# Patient Record
Sex: Female | Born: 1990 | Race: White | Hispanic: No | Smoking: Never smoker
Health system: Southern US, Community
[De-identification: ages and names within clinical notes are randomized; demographics above are authoritative.]

## PROBLEM LIST (undated history)

## (undated) DIAGNOSIS — F32A Depression, unspecified: Secondary | ICD-10-CM

## (undated) DIAGNOSIS — D649 Anemia, unspecified: Secondary | ICD-10-CM

## (undated) DIAGNOSIS — I498 Other specified cardiac arrhythmias: Secondary | ICD-10-CM

## (undated) DIAGNOSIS — F329 Major depressive disorder, single episode, unspecified: Secondary | ICD-10-CM

## (undated) DIAGNOSIS — R000 Tachycardia, unspecified: Secondary | ICD-10-CM

## (undated) DIAGNOSIS — I951 Orthostatic hypotension: Secondary | ICD-10-CM

## (undated) DIAGNOSIS — F419 Anxiety disorder, unspecified: Secondary | ICD-10-CM

## (undated) DIAGNOSIS — J45909 Unspecified asthma, uncomplicated: Secondary | ICD-10-CM

## (undated) DIAGNOSIS — G90A Postural orthostatic tachycardia syndrome (POTS): Secondary | ICD-10-CM

## (undated) DIAGNOSIS — F909 Attention-deficit hyperactivity disorder, unspecified type: Secondary | ICD-10-CM

## (undated) DIAGNOSIS — F840 Autistic disorder: Secondary | ICD-10-CM

## (undated) DIAGNOSIS — T7840XA Allergy, unspecified, initial encounter: Secondary | ICD-10-CM

## (undated) HISTORY — DX: Unspecified asthma, uncomplicated: J45.909

## (undated) HISTORY — DX: Major depressive disorder, single episode, unspecified: F32.9

## (undated) HISTORY — DX: Depression, unspecified: F32.A

## (undated) HISTORY — DX: Allergy, unspecified, initial encounter: T78.40XA

## (undated) HISTORY — PX: TONSILLECTOMY: SUR1361

## (undated) HISTORY — DX: Anemia, unspecified: D64.9

---

## 2011-12-06 DIAGNOSIS — F845 Asperger's syndrome: Secondary | ICD-10-CM | POA: Insufficient documentation

## 2011-12-06 DIAGNOSIS — F419 Anxiety disorder, unspecified: Secondary | ICD-10-CM | POA: Insufficient documentation

## 2014-04-27 DIAGNOSIS — F329 Major depressive disorder, single episode, unspecified: Secondary | ICD-10-CM | POA: Insufficient documentation

## 2014-04-27 DIAGNOSIS — F32A Depression, unspecified: Secondary | ICD-10-CM | POA: Insufficient documentation

## 2015-08-26 HISTORY — PX: VAGINOPLASTY: SHX329

## 2015-12-30 ENCOUNTER — Encounter (HOSPITAL_COMMUNITY): Payer: Self-pay | Admitting: Emergency Medicine

## 2015-12-30 ENCOUNTER — Ambulatory Visit (HOSPITAL_COMMUNITY)
Admission: EM | Admit: 2015-12-30 | Discharge: 2015-12-30 | Disposition: A | Discharge status: Home / Self Care | Payer: PRIVATE HEALTH INSURANCE | Attending: Internal Medicine | Admitting: Internal Medicine

## 2015-12-30 DIAGNOSIS — L02415 Cutaneous abscess of right lower limb: Secondary | ICD-10-CM

## 2015-12-30 HISTORY — DX: Anxiety disorder, unspecified: F41.9

## 2015-12-30 HISTORY — DX: Tachycardia, unspecified: R00.0

## 2015-12-30 HISTORY — DX: Autistic disorder: F84.0

## 2015-12-30 HISTORY — DX: Attention-deficit hyperactivity disorder, unspecified type: F90.9

## 2015-12-30 MED ORDER — DOXYCYCLINE HYCLATE 100 MG PO CAPS: 100.0000 mg | ORAL_CAPSULE | Freq: Two times a day (BID) | ORAL | 0 refills | Status: DC

## 2015-12-30 MED ORDER — NAPROXEN 500 MG PO TABS: 500.0000 mg | ORAL_TABLET | Freq: Two times a day (BID) | ORAL | 0 refills | Status: DC

## 2015-12-30 NOTE — ED Triage Notes (Signed)
Patient reports back of right knee with an abscess.  History of abscess.  Noticed one week ago.  Size of knot and pain  has increased over the past 2 days

## 2015-12-30 NOTE — ED Provider Notes (Signed)
[facility]    CSN: [id number] Arrival date & time: 12/30/15  1429  First Provider Contact:  First MD Initiated Contact with Patient 12/30/15 1615        History   Chief Complaint Chief Complaint  Patient presents with  . Knee Pain    HPI Maria Bond. is a 25 y.o. female. He presents today with a one-week history of increasingly sore insect bite behind the right knee. No fever, no malaise, no nausea/vomiting. History of 2 prior similar episodes involving the right knee; those were anterior, and this one is posterior.     HPI  Past Medical History:  Diagnosis Date  . ADHD (attention deficit hyperactivity disorder)   . Anxiety   . Autistic disorder   . Tachycardia    Past Surgical History:  Procedure Laterality Date  . gender reassignment       Home Medications    Prior to Admission medications   Medication Sig Start Date End Date Taking? Authorizing Provider  amphetamine-dextroamphetamine (ADDERALL) 15 MG tablet Take 15 mg by mouth 2 (two) times daily.   Yes Historical Provider, MD  busPIRone (BUSPAR) 7.5 MG tablet Take 7.5 mg by mouth 2 (two) times daily.   Yes Historical Provider, MD  escitalopram (LEXAPRO) 20 MG tablet Take 20 mg by mouth daily.   Yes Historical Provider, MD  estradiol (ESTRACE) 2 MG tablet Take 2 mg by mouth daily.   Yes Historical Provider, MD  propranolol (INDERAL) 10 MG tablet Take 10 mg by mouth 2 (two) times daily.   Yes Historical Provider, MD    Family History Family History  Problem Relation Age of Onset  . Healthy Mother     Social History Social History  Substance Use Topics  . Smoking status: Never Smoker  . Smokeless tobacco: Never Used  . Alcohol use No     Allergies   Sulfa antibiotics   Review of Systems Review of Systems  All other systems reviewed and are negative.    Physical Exam Triage Vital Signs ED Triage Vitals  Enc Vitals Group     BP 12/30/15 1553 104/70     Pulse Rate 12/30/15  1553 94     Resp 12/30/15 1553 18     Temp 12/30/15 1553 98.1 F (36.7 C)     Temp Source 12/30/15 1553 Oral     SpO2 12/30/15 1553 99 %     Weight --      Height --      Pain Score 12/30/15 1546 3   Updated Vital Signs BP 104/70 (BP Location: Left Wrist)   Pulse 94   Temp 98.1 F (36.7 C) (Oral)   Resp 18   SpO2 99%  Physical Exam  Constitutional: He is oriented to person, place, and time. No distress.  Alert, nicely groomed  HENT:  Head: Atraumatic.  Eyes:  Conjugate gaze, no eye redness/drainage  Neck: Neck supple.  Cardiovascular: Normal rate.   Pulmonary/Chest: No respiratory distress.  Abdominal: He exhibits no distension.  Musculoskeletal: Normal range of motion.  Neurological: He is alert and oriented to person, place, and time.  Skin: Skin is warm and dry.  No cyanosis 2.5 inch zone of erythema behind the right knee, with 2 inch zone of induration and a central pustule. No drainage expressible.  Nursing note and vitals reviewed.    UC Treatments / Results   Procedures Procedures (including critical care time)      Skin was cleaned with  Hibiclens and infiltrated with 2% lidocaine. A stab incision was made and 4-5 mL's of Purulent Material were expressed. The wound was bluntly probed, and irrigated with saline. Antibiotic ointment and a bandage were applied.  Final Clinical Impressions(s) / UC Diagnoses   Final diagnoses:  Abscess of right knee   Wash gently with soap/water 1-2x daily, apply antibiotic ointment/bandaid. Recheck if increasing redness/swelling/drainage/pain or new fever >100.5 occurs. Anticipate gradual healing over the next 7-10 days; if persistent for >3 wks have rechecked.  New Prescriptions Discharge Medication List as of 12/30/2015  5:16 PM    START taking these medications   Details  doxycycline (VIBRAMYCIN) 100 MG capsule Take 1 capsule (100 mg total) by mouth 2 (two) times daily., Starting Sat 12/30/2015, Normal    rx naproxen  also sent to the pharmacy, pt reported after discharge that he would need something for pain.     Eustace MooreLaura W Lindzey Zent, MD 01/01/16 1140

## 2015-12-30 NOTE — Discharge Instructions (Addendum)
Wash gently with soap/water 1-2x daily, apply antibiotic ointment/bandaid. Recheck if increasing redness/swelling/drainage/pain or new fever >100.5 occurs. Anticipate gradual healing over the next 7-10 days; if persistent for >3 wks have rechecked.

## 2016-01-04 ENCOUNTER — Ambulatory Visit
Payer: PRIVATE HEALTH INSURANCE | Attending: Family Medicine | Admitting: Rehabilitative and Restorative Service Providers"

## 2016-01-04 DIAGNOSIS — R2681 Unsteadiness on feet: Secondary | ICD-10-CM | POA: Insufficient documentation

## 2016-01-04 DIAGNOSIS — R2689 Other abnormalities of gait and mobility: Secondary | ICD-10-CM | POA: Diagnosis present

## 2016-01-04 NOTE — Therapy (Signed)
[facility] [address] [place], [place], [address] Phone: [phone]   Fax:  [phone]  Patient Details  Name: Maria Bond. MRN: [id number] Date of Birth: 11-Jun-1990 Referring Provider:  Patria Mane, MD  Encounter Date: 01/04/2016  Mobility/Seating Evaluation    PATIENT INFORMATION: Name: Maria Bond. DOB: ?????  Sex: M (transgender female) Date seen: 01/04/2016 Time: 0930  Address:  [place], [place], [place] [address] Physician: Dr. Patria Mane This evaluation/justification form will serve as the LMN for the following suppliers: __________________________ Supplier: [employer] Contact Person: Deberah Pelton, ATP Phone:  ?????   Seating Therapist: Rudell Cobb, MPT Phone:   [phone]   Phone: see vendor notes    Spouse/Parent/Caregiver name: n/a  Phone number: ????? Insurance/Payer: medcost      Reason for Referral: ultralightweight manual wheelchair  Patient/Caregiver Goals: obtain lighter weight wheelchair  Patient was seen for face-to-face evaluation for new manual wheelchair.  Also present was Deberah Pelton and friend of patient Maria Bond) to discuss recommendations and wheelchair options.  Further paperwork was completed and sent to vendor.  Patient appears to qualify for manual mobility device at this time per objective findings.   MEDICAL HISTORY: Diagnosis: Primary Diagnosis: Weakness, POTS, ADHD, autistic Onset: 2015 Diagnosis: Began as heat intolerance in 2015, led to weakness, and POTS. 8 months ago worsened when getting to class reporting dizziness, shortness of breath, confusion.    _0 Progressive Disease Relevant past and future surgeries: gender reassignment surgery   Height: 5'4" Weight: 112 lbs Explain recent changes or trends in weight: ?????   History including Falls: Multiple episodes of near syncope with getting weakness and having to sit on ground in  community.    HOME ENVIRONMENT: _1 House  _2 Condo/town home  _3 Apartment  _4 Assisted Living    _5 Lives Alone _6  Lives with Others                                                                                          Hours with caregiver: intermittent assist  _7 Home is accessible to patient           Stairs      _8 Yes _9  No     Ramp _10 Yes _11 No Comments:  ramp to be installed by apartment complex; stays independent 8-10 hours/day during roommates 3rd shift work   COMMUNITY ADL: TRANSPORTATION: _12 Car    _13 Van    <GEZMOQHUTMLYYTKP>_5<\/WSFKCLEXNTZGYFVC>_94 Public Transportation    _15 Adapted w/c Lift    _16 Ambulance    _17 Other:       _18 Sits in wheelchair during transport  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  Other: [employer]     FUNCTIONAL/SENSORY PROCESSING SKILLS:  Handedness:   _19 Right     _20 Left    _21 NA  Comments:  ?????  Functional Processing Skills for Wheeled Mobility _22 Processing Skills are adequate for safe wheelchair operation  Areas of concern than may interfere with safe operation of wheelchair Description of problem   _23  Attention to environment      _24   Judgment      _0  Hearing  _1  Vision or visual processing      _2 Motor Planning  _3  Fluctuations in Behavior  ?????    VERBAL COMMUNICATION: _4 WFL receptive _5  WFL expressive _6 Understandable  _7 Difficult to understand  _8 non-communicative _9  Uses an augmented communication device  CURRENT SEATING / MOBILITY: Current Mobility Base:  _10 None _11 Dependent _12 Manual _13 Scooter _14 Power  Type of Control: ?????  Manufacturer:  ?????Size:  ?????Age: 77 months ago rental manual w/c out of pocket expense with comfort cushion  Current Condition of Mobility Base:  good   Current Wheelchair components:  manual w/c with swing away/ elevating leg rest   Describe posture in present seating system:  midline, good upright position       SENSATION and SKIN ISSUES: Sensation _15 Intact  _16 Impaired _17 Absent  Level of sensation: mild area of sensory change after gender reassignment surgery Pressure Relief: Able to perform effective pressure relief :    _18 Yes  _19  No Method: tricep press, leaning If not, Why?: n/a  Skin Issues/Skin Integrity Current Skin Issues  _20 Yes _21 No _22 Intact _23  Red area_24  Open Area  _25 Scar Tissue _26 At risk from prolonged sitting Where  abcess on back of R knee  History of Skin Issues  _27 Yes _28 No Where  ????? When  ?????  Hx of skin flap surgeries  _29 Yes _30 No Where  ????? When  ?????  Limited sitting tolerance _31 Yes _32 No Hours spent sitting in wheelchair daily: continuous 5 hours during class, intermittently in w/c throughout the rest of the day.  Vast majority of the time using manual w/c in home and community.  Prior to rental manual w/c, was unable to get to classes.    Complaint of Pain:  Please describe: back pain after sitting in the wheelchair; h/o R rotator cuff injury that can get irritated with propulsion of current rental manual w/c.    Swelling/Edema: none   ADL STATUS (in reference to wheelchair use):  Indep Assist Unable Indep with Equip Not assessed Comments  Dressing x ????? ????? ????? ????? ?????  Eating x ????? ????? ????? ????? ?????  Toileting x ????? ????? ????? ????? ?????  Bathing x ????? ????? ????? ????? shower chair  Grooming/Hygiene x ????? ????? ????? ????? ?????  Meal Prep ????? x ????? ????? ????? difficulty due to autism  IADLS ????? x ????? ????? ????? ?????  Bowel Management: _33 Continent  _34 Incontinent  _35 Accidents Comments:  ?????  Bladder Management: _36 Continent  _37 Incontinent  _38 Accidents Comments:  ?????     WHEELCHAIR SKILLS: Manual w/c Propulsion: _39 UE or LE strength and endurance sufficient to participate in ADLs using manual wheelchair Arm : _40 left _41 right   _42 Both      Distance: uses legs indoors and around school environment Foot:  _43 left  _44 right   _45 Both  Operate Scooter: _46  Strength, hand grip, balance and transfer appropriate for use _47 Living environment is accessible for use of scooter  Operate Power w/c:  _48  Std. Joystick   _49  Alternative Controls Indep _50  Assist _51  Dependent/unable _52  N/A _53   _54 Safe          _55  Functional      Distance: ?????  Bed confined without wheelchair _56  Yes _57  No   STRENGTH/RANGE OF MOTION:  ????? Range of Motion Strength  Shoulder L UE is WFLs Mild decrease on the right side to 170 degrees 5/5 for L shoulder flexion/abudction 4+/5 for R shoulder flexion/abduction  Elbow WFLs bilaterally 5/5 L elbow flexion extension 5/5 R elbow flexion/extension  Wrist/Hand  WFLs 5/5 for L wrist flexion/extension 5/5 for R wrist flexion/extension  Hip WFLs 5/5 for L hip flexion 5/5 for R hip flexion  Knee WFLs 5/5 for L knee flexion/extension 5/5 for R knee flexion/extension  Ankle WFLs 5/5 for L ankle DF 5/5 for R ankle DF     MOBILITY/BALANCE:  _0  Patient is totally dependent for mobility  ?????    Balance Transfers Ambulation  Sitting Balance: Standing Balance: _1  Independent _2  Independent/Modified Independent  _3  WFL     _4  WFL _5  Supervision _6  Supervision  _7  Uses UE for balance  _8  Supervision _9  Min Assist _10  Ambulates with Assist  ?????    _11  Min Assist _12  Min assist _13  Mod Assist _14  Ambulates with Device:      _15  RW  _16  StW  _17  Cane  _18  ?????  _19  Mod Assist _20  Mod assist _21  Max assist   _22  Max Assist _23  Max assist _24  Dependent _25  Indep. Short Distance Only  _26  Unable _27  Unable _28  Lift / Sling Required Distance (in feet)  ?????   _29  Sliding board _30  Unable to Ambulate (see explanation below)  Cardio Status:  _31 Intact  _32  Impaired   _33  NA     has POTS, reports weakness during campus negotiation and during household tasks  Respiratory Status:  _34 Intact   _35 Impaired   _36 NA     ?????  Orthotics/Prosthetics: none  Comments (Address manual vs power w/c vs scooter): BP at rest  today is 103/65, HR=93 at rest. After walking x 150 feet BP=112/75, 100 bpm.  With ambulation in the clinic, the patient experiences greater difficulty with speech reporting "hard to tell how I feel".   *The patient has a continuing decline in mobility worse over the past 8 months.  She is unable to walk for household function due to lightheadedness/weakness associated with POTS.  She is unable to walk long distances required to access school campus and to participate in course requirements of frequent standing.  Her current rental manual w/c is heavy and she is unable to load this into/out of car by herself.  She would benefit from ultralightweight w/c due to h/o R RTC injury and ability to navigate environment/load w/c into car more independently.          Anterior / Posterior Obliquity Rotation-Pelvis ?????  PELVIS    _37  _38  _39   Neutral Posterior Anterior  _40  _41  _42   WFL Rt elev Lt elev  _43  _44  _45   WFL Right Left                      Anterior    Anterior     _46  Fixed _47  Other _48  Partly Flexible _49  Flexible   _50  Fixed _51  Other _52  Partly Flexible  _53  Flexible  _54  Fixed _55  Other _56  Partly Flexible  _57  Flexible   TRUNK  _58  _59  _60   WFL ? Thoracic ? Lumbar  Kyphosis Lordosis  _61  _62  _63   WFL Convex Convex  Right Left _64 c-curve _65 s-curve _66 multiple  _67  Neutral _68  Left-anterior _69  Right-anterior     _70  Fixed _71  Flexible _72  Partly Flexible _73  Other  _74  Fixed _75  Flexible _76  Partly Flexible _77  Other  _78  Fixed             _79  Flexible _80  Partly Flexible _81  Other    Position Windswept  ?????  HIPS          _82            _83               _84   Neutral       Abduct        ADduct         _0           _1            _2   Neutral Right           Left      _3  Fixed _4  Subluxed _5  Partly Flexible _6  Dislocated _7  Flexible  _8  Fixed _9  Other _10  Partly Flexible  _11  Flexible                 Foot Positioning Knee Positioning  ?????    _12  WFL  _13 Lt _14 Rt _15  WFL  _16 Lt _17 Rt    KNEES ROM  concerns: ROM concerns:    & Dorsi-Flexed _18 Lt _19 Rt ?????    FEET Plantar Flexed _20 Lt _21 Rt      Inversion                 _22 Lt _23 Rt      Eversion                 _24 Lt _25 Rt     HEAD _26  Functional _27  Good Head Control  ?????  & _28  Flexed         _29  Extended _30  Adequate Head Control    NECK _31  Rotated  Lt  _32  Lat Flexed Lt _33  Rotated  Rt _34  Lat Flexed Rt _35  Limited Head Control     _36  Cervical Hyperextension _37  Absent  Head Control     SHOULDERS ELBOWS WRIST& HAND ?????      Left     Right    Left     Right    Left     Right   U/E _38 Functional           _39 Functional WFLs WFLs _40 Fisting             _41 Fisting      _42 elev   _43 dep      _44 elev   _45 dep       _46 pro -_47 retract     _48 pro  _49 retract _50 subluxed             _51 subluxed           Goals for Wheelchair Mobility  _52  Independence with mobility in the home with motor related ADLs (MRADLs)  _53  Independence with MRADLs in the community _54  Provide dependent mobility  _55  Provide recline     _56 Provide tilt   Goals for Seating system _57  Optimize pressure distribution _58  Provide support needed to facilitate function or safety _59  Provide corrective forces to assist with maintaining or improving posture _60  Accommodate client's posture:   current seated postures and positions are not flexible or will not tolerate corrective forces _61  Client to be independent with relieving pressure in the wheelchair _62 Enhance physiological function such as breathing, swallowing, digestion  Simulation ideas/Equipment trials:can propel manual wheelchair independently  State why other equipment was unsuccessful:?????   MOBILITY BASE RECOMMENDATIONS and JUSTIFICATION: MOBILITY COMPONENT JUSTIFICATION  Manufacturer: [employer] Model: ?????   Size: Width 14Seat Depth 18 _63 provide transport from point A to B      _64 promote Indep mobility  _65 is not a safe, functional ambulator _66 walker or cane inadequate _67 non-standard width/depth necessary to accommodate  anatomical measurement _68  ?????  _69 Manual Mobility Base _70 non-functional ambulator    _71 Scooter/POV  _72 can safely operate  _73 can safely transfer   _74 has adequate trunk stability  _75 cannot functionally propel manual w/c  _76   Power Mobility Base  _0 non-ambulatory  _1 cannot functionally propel manual wheelchair  _2  cannot functionally and safely operate scooter/POV _3 can safely operate and willing to  _4 Stroller Base _5 infant/child  _6 unable to propel manual wheelchair _7 allows for growth _8 non-functional ambulator _9 non-functional UE _10 Indep mobility is not a goal at this time  _11 Tilt  _12 Forward _13 Backward _14 Powered tilt  _15 Manual tilt  _16 change position against gravitational force on head and shoulders  _17 change position for pressure relief/cannot weight shift _18 transfers  _19 management of tone _20 rest periods _21 control edema _22 facilitate postural control  _23  ?????  _24 Recline  _25 Power recline on power base _26 Manual recline on manual base  _27 accommodate femur to back angle  _28 bring to full recline for ADL care  _29 change position for pressure relief/cannot weight shift _30 rest periods _31 repositioning for transfers or clothing/diaper /catheter changes _32 head positioning  _33 Lighter weight required _34 self- propulsion  _35 lifting _36  ?????  _37 Heavy Duty required _38 user weight greater than 250# _39 extreme tone/ over active movement _40 broken frame on previous chair _41  ?????  _42  Back  _43  Angle Adjustable _44  Custom molded tension adjustable/ air breathable mesh material _45 postural control _46 control of tone/spasticity _47 accommodation of range of motion _48 UE functional control _49 accommodation for seating system _50  ????? _51 provide lateral trunk support _52 accommodate deformity _53 provide posterior trunk support _54 provide lumbar/sacral support _55 support trunk in midline _56 Pressure relief over spinal processes  _57  Seat Cushion comfort company, curve cushion _58 impaired sensation   _59 decubitus ulcers present _60 history of pressure ulceration _61 prevent pelvic extension _62 low maintenance  _63 stabilize pelvis  _64 accommodate obliquity _65 accommodate multiple deformity _66 neutralize lower extremity position _67 increase pressure distribution _68  ?????  _69  Pelvic/thigh support  _70  Lateral thigh guide _71  Distal medial pad  _72  Distal lateral pad _73  pelvis in neutral _74 accommodate pelvis _75  position upper legs _76  alignment _77  accommodate ROM _78  decr adduction _79 accommodate tone _80 removable for transfers _81 decr abduction  _82  Lateral trunk Supports _83  Lt     _84  Rt _85 decrease lateral trunk leaning _86 control tone _87 contour for increased contact _88 safety  _89 accommodate asymmetry _90  ?????  _91  Mounting hardware  _92 lateral trunk supports  _93 back   _94 seat _95 headrest      _96  thigh support _97 fixed   _98 swing away _99 attach seat platform/cushion to w/c frame _100 attach back cushion to w/c frame _101 mount postural supports _102 mount headrest  _103 swing medial thigh support away _104 swing lateral supports away for transfers  _105  ?????    Armrests  _106 fixed _107 adjustable height _108 removable   _109 swing away  _110 flip back   _111 reclining _112 full length pads _113 desk    _114 pads tubular  _115 provide support with elbow at 90   _116 provide support for w/c tray _117 change of height/angles for variable activities _118 remove for transfers _119 allow to come closer to table top _120 remove for access to tables _121  NONE  Hangers/ Leg rests  _122 60 _123 70 _124 90 _125 elevating _126 heavy duty  _127 articulating _128 fixed _129 lift off _130 swing away     _131 power _132 provide LE support  _133 accommodate to hamstring tightness _134 elevate legs during recline   _135 provide change in position for Legs _136 Maintain placement of feet on footplate _137 durability _138 enable transfers _139 decrease edema _140 Accommodate lower leg length _141  85 degrees  Foot support Footplate    <FVCBSWHQPRFFMBWG>_6<\/KZLDJTTSVXBLTJQZ>_009 Lt  _143  Rt  _144  Center mount _145 flip up     _146 depth/angle adjustable _147 Amputee  adapter    _148  Lt     _149  Rt _150 provide foot support _151 accommodate to ankle ROM _152 transfers _153 Provide support for residual extremity _154  allow foot to go under wheelchair base _155  decrease tone  _156  ?????  _157  Ankle strap/heel loops _158 support foot on foot  support _0 decrease extraneous movement _1 provide input to heel  _2 protect foot  Tires: _3 pneumatic  _4 flat free inserts  _5 solid  _6 decrease maintenance  _7 prevent frequent flats _8 increase shock absorbency _9 decrease pain from road shock _10 decrease spasms from road shock _11  2 degree camber; 5 x 1.5 light speed soft roll caster tire  _12  Headrest  _13 provide posterior head support _14 provide posterior neck support _15 provide lateral head support _16 provide anterior head support _17 support during tilt and recline _18 improve feeding   _19 improve respiration _20 placement of switches _21 safety  _22 accommodate ROM  _23 accommodate tone _24 improve visual orientation  _25  Anterior chest strap _26  Vest _27  Shoulder retractors  _28 decrease forward movement of shoulder _29 accommodation of TLSO _30 decrease forward movement of trunk _31 decrease shoulder elevation _32 added abdominal support _33 alignment _34 assistance with shoulder control  _35  ?????  Pelvic Positioner _36 Belt _37 SubASIS bar _38 Dual Pull _39 stabilize tone _40 decrease falling out of chair/ **will not Decr potential for sliding due to pelvic tilting _41 prevent excessive rotation _42 pad for protection over boney prominence _43 prominence comfort _44 special pull angle to control rotation _45  ?????  Upper Extremity Support _46 L   _47  R _48 Arm trough    _49 hand support _50  tray       _51 full tray _52 swivel mount _53 decrease edema      _54 decrease subluxation   _55 control tone   _56 placement for AAC/Computer/EADL _57 decrease gravitational pull on shoulders _58 provide midline positioning _59 provide support to increase UE function _60 provide hand support in natural position _61 provide work surface   POWER  WHEELCHAIR CONTROLS  _62 Proportional  _63 Non-Proportional Type ????? _64 Left  _65 Right _66 provides access for controlling wheelchair   _67 lacks motor control to operate proportional drive control <UMPNTIRWERXVQMGQ>_6<\/PYPPJKDTOIZTIWPY>_09 unable to understand proportional controls  Actuator Control Module  _69 Single  _70 Multiple   _71 Allow the client to operate the power seat function(s) through the joystick control   _72 Safety Reset Switches _73 Used to change modes and stop the wheelchair when driving in latch mode    _74 Upgraded Electronics   _75 programming for accurate control _76 progressive Disease/changing condition _77 non-proportional drive control needed _78 Needed in order to operate power seat functions through joystick control   _79 Display box _80 Allows user to see in which mode and drive the wheelchair is set  _81 necessary for alternate controls    _82 Digital interface electronics _83 Allows w/c to operate when using alternative drive controls  <XIPJASNKNLZJQBHA>_1<\/PFXTKWIOXBDZHGDJ>_24 ASL Head Array _85 Allows client to operate wheelchair  through switches placed in tri-panel headrest  _86 Sip and puff with tubing kit _87 needed to operate sip and puff drive controls  <QASTMHDQQIWLNLGX>_2<\/JJHERDEYCXKGYJEH>_63 Upgraded tracking electronics _89 increase safety when driving <JSHFWYOVZCHYIFOY>_7<\/XAJOINOMVEHMCNOB>_09 correct tracking when on uneven surfaces  _91 [facility] for switches or joystick _92 Attaches switches to w/c  _93 Swing away for access or transfers _94 midline for optimal placement _95 provides for consistent access  _96 Attendant controlled joystick plus mount _97 safety _98 long distance driving <GGEZMOQHUTMLYYTK>_3<\/TWSFKCLEXNTZGYFV>_49 operation of seat functions _100 compliance with transportation regulations _101  ?????    Rear wheel placement/Axle adjustability _102 None _103 semi adjustable _104 fully adjustable  _105 improved UE access to wheels _106 improved stability _107 changing angle in space for improvement of postural stability _108 1-arm drive access <SWHQPRFFMBWGYKZL>_9<\/JTTSVXBLTJQZESPQ>_330 amputee pad placement _110  ?????  Wheel rims/ hand rims  _111 metal  _112 plastic coated _113 oblique projections _114 vertical projections _115 Provide ability to propel manual  wheelchair  _116  Increase self-propulsion with hand weakness/decreased grasp  Push handles _117 extended  _118 angle adjustable  _119 standard _120 caregiver access _121 caregiver assist _122 allows "hooking" to enable increased ability to perform ADLs or maintain balance  One armed device  _123 Lt   _124 Rt _125 enable propulsion of manual wheelchair with one arm   _126  ?????   Brake/wheel lock extension _127  Lt   _128   Rt _0 increase indep in applying wheel locks   _1 Side guards _2 prevent clothing getting caught in wheel or becoming soiled _3  prevent skin tears/abrasions  Battery: ????? _4 to power wheelchair ?????  Other: See ????? standard hand rim scissor lock brakes integrated push handles anti-tippers  The above equipment has a life- long use expectancy. Growth and changes in medical and/or functional conditions would be the exceptions. This is to certify that the therapist has no financial relationship with durable medical provider or manufacturer. The therapist will not receive remuneration of any kind for the equipment recommended in this evaluation.   Patient has mobility limitation that significantly impairs safe, timely participation in one or more mobility related ADL's.  (bathing, toileting, feeding, dressing, grooming, moving from room to room)                                                             _5  Yes _6  No Will mobility device sufficiently improve ability to participate and/or be aided in participation of MRADL's?         _7  Yes _8  No Can limitation be compensated for with use of a cane or walker?                                                                                _9  Yes _10  No Does patient or caregiver demonstrate ability/potential ability & willingness to safely use the mobility device?   _11  Yes _12  No Does patient's home environment support use of recommended mobility device?                                                    _13  Yes _14  No Does patient have sufficient upper extremity  function necessary to functionally propel a manual wheelchair?    _15  Yes _16  No Does patient have sufficient strength and trunk stability to safely operate a POV (scooter)?                                  _17  Yes _18  No Does patient need additional features/benefits provided by a power wheelchair for MRADL's in the home?       _19  Yes _20  No Does the patient demonstrate the ability to safely use a power wheelchair?                                                              _21  Yes _22  No  Therapist Name Printed: Rudell Cobb, MPT Date: 01/04/2016  Therapist's Signature:   Date:   Supplier's Name Printed: Deberah Pelton, [employer]  Date: 01/04/2016  Supplier's Signature:   Date:  Patient/Caregiver Signature:   Date:     This is to certify that I have read this evaluation and do agree with the content within:      Physician's Name Printed: Patria Mane, MD  87 Signature:  Date:     This is to certify that I, the above signed therapist have the following affiliations: _0  This DME provider _1  Manufacturer of recommended equipment _2  Patient's long term care facility _3  None of the above        [place],  MPT 01/04/2016, 10:48 AM  [facility] [address] [place], [place], [address] Phone: [phone]   Fax:  [phone]

## 2017-01-10 ENCOUNTER — Ambulatory Visit (HOSPITAL_COMMUNITY)
Admission: EM | Admit: 2017-01-10 | Discharge: 2017-01-10 | Disposition: A | Discharge status: Home / Self Care | Payer: PRIVATE HEALTH INSURANCE | Attending: Physician Assistant | Admitting: Physician Assistant

## 2017-01-10 ENCOUNTER — Encounter (HOSPITAL_COMMUNITY): Payer: Self-pay | Admitting: Family Medicine

## 2017-01-10 DIAGNOSIS — J340 Abscess, furuncle and carbuncle of nose: Secondary | ICD-10-CM | POA: Diagnosis not present

## 2017-01-10 MED ORDER — DOXYCYCLINE HYCLATE 100 MG PO CAPS: 100.0000 mg | ORAL_CAPSULE | Freq: Two times a day (BID) | ORAL | 0 refills | Status: DC

## 2017-01-10 MED ORDER — DICLOFENAC SODIUM 75 MG PO TBEC: 75.0000 mg | DELAYED_RELEASE_TABLET | Freq: Two times a day (BID) | ORAL | 0 refills | Status: DC

## 2017-01-10 MED ORDER — KETOROLAC TROMETHAMINE 60 MG/2ML IM SOLN
INTRAMUSCULAR | Status: AC
  Filled 2017-01-10: qty 2

## 2017-01-10 MED ORDER — KETOROLAC TROMETHAMINE 60 MG/2ML IM SOLN
60.0000 mg | Freq: Once | INTRAMUSCULAR | Status: AC
  Administered 2017-01-10: 60 mg via INTRAMUSCULAR

## 2017-01-10 NOTE — Discharge Instructions (Signed)
Sorry that we could not obtain pus from this area. It is open which is good. Keep applying warm compresses to this area throughout the next few days. Take your antibiotics. If this is not improving I listed the ENT to see for further evaluation. May take the pain medication as needed for discomfort.

## 2017-01-10 NOTE — ED Provider Notes (Signed)
[facility]    CSN: [id number] Arrival date & time: 01/10/17  1303     History   Chief Complaint Chief Complaint  Patient presents with  . Abscess    HPI Maria Bond is a 26 y.o. female.   Who presents with a sore in the right nostril. Onset x 4 days with no drainage. No fever or chills is noted. No prior history.       Past Medical History:  Diagnosis Date  . ADHD (attention deficit hyperactivity disorder)   . Anxiety   . Autistic disorder   . Tachycardia     There are no active problems to display for this patient.   Past Surgical History:  Procedure Laterality Date  . gender reassignment      OB History    No data available       Home Medications    Prior to Admission medications   Medication Sig Start Date End Date Taking? Authorizing Provider  amphetamine-dextroamphetamine (ADDERALL) 15 MG tablet Take 15 mg by mouth 2 (two) times daily.    [provider]  busPIRone (BUSPAR) 7.5 MG tablet Take 7.5 mg by mouth 2 (two) times daily.    [provider]  diclofenac (VOLTAREN) 75 MG EC tablet Take 1 tablet (75 mg total) by mouth 2 (two) times daily. 01/10/17   Riki Sheer, PA-C  doxycycline (VIBRAMYCIN) 100 MG capsule Take 1 capsule (100 mg total) by mouth 2 (two) times daily. 01/10/17   Riki Sheer, PA-C  escitalopram (LEXAPRO) 20 MG tablet Take 20 mg by mouth daily.    [provider]  estradiol (ESTRACE) 2 MG tablet Take 2 mg by mouth daily.    [provider]  propranolol (INDERAL) 10 MG tablet Take 10 mg by mouth 2 (two) times daily.    [provider]    Family History Family History  Problem Relation Age of Onset  . Healthy Mother     Social History Social History  Substance Use Topics  . Smoking status: Never Smoker  . Smokeless tobacco: Never Used  . Alcohol use No     Allergies   Sulfa antibiotics   Review of Systems Review of Systems  Constitutional:  Negative for fever.     Physical Exam Triage Vital Signs ED Triage Vitals  Enc Vitals Group     BP 01/10/17 1346 111/71     Pulse Rate 01/10/17 1346 92     Resp 01/10/17 1346 18     Temp 01/10/17 1346 98.1 F (36.7 C)     Temp src --      SpO2 01/10/17 1346 100 %     Weight --      Height --      Head Circumference --      Peak Flow --      Pain Score 01/10/17 1347 2     Pain Loc --      Pain Edu? --      Excl. in GC? --    No data found.   Updated Vital Signs BP 111/71   Pulse 92   Temp 98.1 F (36.7 C)   Resp 18   SpO2 100%   Visual Acuity Right Eye Distance:   Left Eye Distance:   Bilateral Distance:    Right Eye Near:   Left Eye Near:    Bilateral Near:     Physical Exam  Constitutional: She is oriented to person, place, and  time. She appears well-developed and well-nourished.  HENT:  Pea sized abscess to lower medial nostril with pain to palpation partially fluctuant, no drainage  Neurological: She is alert and oriented to person, place, and time.  Skin: Skin is warm and dry.  Psychiatric: Her behavior is normal.  Nursing note and vitals reviewed.    UC Treatments / Results  Labs (all labs ordered are listed, but only abnormal results are displayed) Labs Reviewed - No data to display  EKG  EKG Interpretation None       Radiology No results found.  Procedures Procedures (including critical care time)  Medications Ordered in UC Medications  ketorolac (TORADOL) injection 60 mg (60 mg Intramuscular Given 01/10/17 1528)     Initial Impression / Assessment and Plan / UC Course  I have reviewed the triage vital signs and the nursing notes.  Pertinent labs & imaging results that were available during my care of the patient were reviewed by me and considered in my medical decision making (see chart for details).    Attempted to I&D area with only blood release. Suggest warm compresses and antibiotic therapy. Left open to drain. Return if  worsens or concerns. Also given information on ENT if persistent pain.   Final Clinical Impressions(s) / UC Diagnoses   Final diagnoses:  Abscess of nose (septum)    New Prescriptions Discharge Medication List as of 01/10/2017  3:16 PM    START taking these medications   Details  diclofenac (VOLTAREN) 75 MG EC tablet Take 1 tablet (75 mg total) by mouth 2 (two) times daily., Starting Fri 01/10/2017, Normal         Controlled Substance Prescriptions [place] Controlled Substance Registry consulted? Not Applicable   Riki Sheer, PA-C 01/10/17 1624

## 2017-01-10 NOTE — ED Triage Notes (Signed)
Pt here for abscess to nares x 4 days. Denies drainage.

## 2017-03-24 ENCOUNTER — Ambulatory Visit (INDEPENDENT_AMBULATORY_CARE_PROVIDER_SITE_OTHER): Payer: PRIVATE HEALTH INSURANCE | Admitting: Family Medicine

## 2017-03-24 ENCOUNTER — Other Ambulatory Visit: Payer: Self-pay

## 2017-03-24 ENCOUNTER — Encounter: Payer: Self-pay | Admitting: Family Medicine

## 2017-03-24 VITALS — BP 110/72 | HR 94 | Temp 98.5°F | Resp 18 | Ht 65.35 in | Wt 113.0 lb

## 2017-03-24 DIAGNOSIS — M25511 Pain in right shoulder: Secondary | ICD-10-CM

## 2017-03-24 DIAGNOSIS — F418 Other specified anxiety disorders: Secondary | ICD-10-CM

## 2017-03-24 MED ORDER — LORAZEPAM 0.5 MG PO TABS: 0.5000 mg | ORAL_TABLET | Freq: Every day | ORAL | 0 refills | Status: DC | PRN

## 2017-03-24 MED ORDER — ESCITALOPRAM OXALATE 20 MG PO TABS
20.0000 mg | ORAL_TABLET | Freq: Every day | ORAL | 1 refills | Status: DC
Start: 2017-03-24 — End: 2017-06-09

## 2017-03-24 MED ORDER — BUSPIRONE HCL 7.5 MG PO TABS: 7.5000 mg | ORAL_TABLET | Freq: Two times a day (BID) | ORAL | 1 refills | Status: DC

## 2017-03-24 NOTE — Progress Notes (Signed)
Subjective:  By signing my name below, I, Maria Bond, attest that this documentation has been prepared under the direction and in the presence of Shade FloodJeffrey R Jaree Dwight, MD Electronically Signed: Charline BillsEssence Bond, ED Scribe 03/24/2017 at 4:00 PM.   Patient ID: Maria Bond, adult    DOB: 11/22/1990, 26 y.o.   MRN: [id number]  Chief Complaint  Patient presents with  . Medication Refill    buspar, lexapro, and ativan   . Depression    screening was a 12    HPI Maria Bond is a 26 y.o. adult who [facility] med [facility]. New pt to me. H/o depression and anxiety, autism spectrum disorder, ADHD based on prob list.   Pt was last seen by [facility] ~1 yr ago, last seen by psychiatrist Dr. Christy GentlesAndrew Shaw 3-4 months ago. Also [facility] [place] Dr. Marline BackboneLisa Guy. Lexapro 20 mg every morning, Buspar bid, Ativan 0.5 mg initially PRN but now every other day for anxiety. Takes Adderall 15 mg bid but states she still has a 2 month supply from psychiatrist. 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 She does walk around her apartment but uses her wheel chair when she goes out. Pt was in school at [employer] but states it was not the best environment for students with disabilities. She is seeing a Herbalistvocational specialist, Vanessa KickJudy Williams, to find some local options. Denies SI/HI, alcohol use.  Depression screen PHQ 2/9 03/24/2017  Decreased Interest 2  Down, Depressed, Hopeless 2  PHQ - 2 Score 4  Altered sleeping 2  Tired, decreased energy 0  Change in appetite 3  Feeling bad or failure about yourself  3  Trouble concentrating 0  Moving slowly or fidgety/restless 0  Suicidal thoughts 0  PHQ-9 Score 12   R Shoulder Pain Pt reports intermittent R shoulder pain x 3 yrs, exacerbated with driving and repetitive movements. She did have a fall with her R shoulder above her head ~4 yrs ago but states ROM has improved over  the yrs. Pt has not been using Flexeril lately that she was initially prescribed for shoulder pain. She now treats pain with 400 mg ibuprofen 1-2 times most days. No h/o stomach ulcers.   There are no active problems to display for this patient.  Past Medical History:  Diagnosis Date  . ADHD (attention deficit hyperactivity disorder)   . Allergy   . Anemia   . Anxiety   . Autistic disorder   . Depression   . Tachycardia    Past Surgical History:  Procedure Laterality Date  . gender reassignment     Allergies  Allergen Reactions  . Sulfa Antibiotics    Prior to Admission medications   Medication Sig Start Date End Date Taking? Authorizing Provider  amphetamine-dextroamphetamine (ADDERALL) 15 MG tablet Take 15 mg by mouth 2 (two) times daily.    [provider]  busPIRone (BUSPAR) 7.5 MG tablet Take 7.5 mg by mouth 2 (two) times daily.    [provider]  diclofenac (VOLTAREN) 75 MG EC tablet Take 1 tablet (75 mg total) by mouth 2 (two) times daily. 01/10/17   Riki SheerYoung, Michelle G, PA-C  doxycycline (VIBRAMYCIN) 100 MG capsule Take 1 capsule (100 mg total) by mouth 2 (two) times daily. 01/10/17   Riki SheerYoung, Michelle G, PA-C  escitalopram (LEXAPRO) 20 MG tablet Take 20 mg by mouth daily.    [provider]  estradiol (ESTRACE) 2 MG tablet Take 2 mg by mouth daily.    [provider]  propranolol (INDERAL) 10 MG tablet Take 10 mg by mouth 2 (two) times daily.    [provider]   Social History   Socioeconomic History  . Marital status: Single    Spouse name: Not on file  . Number of children: Not on file  . Years of education: Not on file  . Highest education level: Not on file  Social Needs  . Financial resource strain: Not on file  . Food insecurity - worry: Not on file  . Food insecurity - inability: Not on file  . Transportation needs - medical: Not on file  . Transportation needs - non-medical: Not on file  Occupational History  .  Not on file  Tobacco Use  . Smoking status: Never Smoker  . Smokeless tobacco: Never Used  Substance and Sexual Activity  . Alcohol use: No  . Drug use: No  . Sexual activity: Not on file  Other Topics Concern  . Not on file  Social History Narrative  . Not on file   Review of Systems  Musculoskeletal: Positive for arthralgias.  Psychiatric/Behavioral: Positive for dysphoric mood.      Objective:   Physical Exam  Constitutional: She is oriented to person, place, and time. She appears well-developed and well-nourished. No distress.  HENT:  Head: Normocephalic and atraumatic.  Eyes: Conjunctivae and EOM are normal.  Neck: Neck supple. No tracheal deviation present.  Cardiovascular: Normal rate.  Pulmonary/Chest: Effort normal. No respiratory distress.  Musculoskeletal: Normal range of motion.  R shoulder: no [place], clavicle or AC tenderness. No focal bony tenderness. Abduction limited by ~45-55 degrees on R. Flexion is intact. Able to get a little more Abduction but still somewhat guarded. Neg [place]. Neg Neer. Equal/full rotator cuff strength.  Neurological: She is alert and oriented to person, place, and time.  Skin: Skin is warm and dry.  Psychiatric: She has a normal mood and affect. Her behavior is normal.  Nursing note and vitals reviewed.  Vitals:   03/24/17 1511  BP: 110/72  Pulse: 94  Resp: 18  Temp: 98.5 F (36.9 C)  TempSrc: Oral  SpO2: 97%  Weight: 113 lb (51.3 kg)  Height: 5' 5.35" (1.66 m)      Assessment & Plan:    Maria Bond is a 26 y.o. adult Depression with anxiety - Plan: LORazepam (ATIVAN) 0.5 MG tablet, escitalopram (LEXAPRO) 20 MG tablet, busPIRone (BUSPAR) 7.5 MG tablet, Ambulatory referral to Psychiatry  - refilled prior meds until can be seen by psychiatry. Records requested from prior providers.   -psychiatry referral placed.   Pain in joint of right shoulder  - may have som impingement vs component of adhesive capsulitis.   -  ROM/stretches, with ibuprofen up to 600mg  Q6h prn.   - obtain outside records, then follow up to discuss prior and future workup.   Meds ordered this encounter  Medications  . LORazepam (ATIVAN) 0.5 MG tablet    Sig: Take 1 tablet (0.5 mg total) by mouth daily as needed for anxiety.    Dispense:  30 tablet    Refill:  0  . escitalopram (LEXAPRO) 20 MG tablet    Sig: Take 1 tablet (20 mg total) by mouth daily.    Dispense:  30 tablet    Refill:  1  . busPIRone (BUSPAR) 7.5 MG tablet    Sig: Take 1 tablet (7.5 mg total)  by mouth 2 (two) times daily.    Dispense:  60 tablet    Refill:  1   Patient Instructions   I will refer you to psychiatry, but they will likely need her records from previous provider. Medications were refilled temporarily. If you need further refills prior to seeing psychiatry, please follow-up to discuss further.  Ibuprofen as needed temporarily for your shoulder pain, with gentle range of motion or stretches as tolerated. Follow-up with me in the next few weeks to discuss that further and we can review previous records to determine if x-ray needed or next step.  Return to the clinic or go to the nearest emergency room if any of your symptoms worsen or new symptoms occur.  Thank you for coming in today.    Shoulder Pain Many things can cause shoulder pain, including:  An injury to the area.  Overuse of the shoulder.  Arthritis.  The source of the pain can be:  Inflammation.  An injury to the shoulder joint.  An injury to a tendon, ligament, or bone.  Follow these instructions at home: Take these actions to help with your pain:  Squeeze a soft ball or a foam pad as much as possible. This helps to keep the shoulder from swelling. It also helps to strengthen the arm.  Take over-the-counter and prescription medicines only as told by your health care provider.  If directed, apply ice to the area: ? Put ice in a plastic bag. ? Place a towel between  your skin and the bag. ? Leave the ice on for 20 minutes, 2-3 times per day. Stop applying ice if it does not help with the pain.  If you were given a shoulder sling or immobilizer: ? Wear it as told. ? Remove it to shower or bathe. ? Move your arm as little as possible, but keep your hand moving to prevent swelling.  Contact a health care provider if:  Your pain gets worse.  Your pain is not relieved with medicines.  New pain develops in your arm, hand, or fingers. Get help right away if:  Your arm, hand, or fingers: ? Tingle. ? Become numb. ? Become swollen. ? Become painful. ? Turn white or blue. This information is not intended to replace advice given to you by your health care provider. Make sure you discuss any questions you have with your health care provider. Document Released: 01/02/2005 Document Revised: 11/19/2015 Document Reviewed: 07/18/2014 Elsevier Interactive Patient Education  2017 [employer]    IF you received an x-ray today, you will receive an invoice from [facility]. Please contact [facility] at [phone] with questions or concerns regarding your invoice.   IF you received labwork today, you will receive an invoice from [facility]. Please contact [facility] at [phone] with questions or concerns regarding your invoice.   Our billing staff will not be able to assist you with questions regarding bills from these companies.  You will be contacted with the lab results as soon as they are available. The fastest way to get your results is to activate your My Chart account. Instructions are located on the last page of this paperwork. If you have not heard from us regarding the results in 2 weeks, please contact this office.      I personally performed the services described in this documentation, which was scribed in my presence. The recorded information has been reviewed and considered for accuracy and completeness, addended by me as  needed, and agree  with information above.  Signed,   Meredith Staggers, MD Primary Care at [facility].  03/26/17 9:50 PM

## 2017-03-24 NOTE — Patient Instructions (Addendum)
I will refer you to psychiatry, but they will likely need her records from previous provider. Medications were refilled temporarily. If you need further refills prior to seeing psychiatry, please follow-up to discuss further.  Ibuprofen as needed temporarily for your shoulder pain, with gentle range of motion or stretches as tolerated. Follow-up with me in the next few weeks to discuss that further and we can review previous records to determine if x-ray needed or next step.  Return to the clinic or go to the nearest emergency room if any of your symptoms worsen or new symptoms occur.  Thank you for coming in today.    Shoulder Pain Many things can cause shoulder pain, including:  An injury to the area.  Overuse of the shoulder.  Arthritis.  The source of the pain can be:  Inflammation.  An injury to the shoulder joint.  An injury to a tendon, ligament, or bone.  Follow these instructions at home: Take these actions to help with your pain:  Squeeze a soft ball or a foam pad as much as possible. This helps to keep the shoulder from swelling. It also helps to strengthen the arm.  Take over-the-counter and prescription medicines only as told by your health care provider.  If directed, apply ice to the area: ? Put ice in a plastic bag. ? Place a towel between your skin and the bag. ? Leave the ice on for 20 minutes, 2-3 times per day. Stop applying ice if it does not help with the pain.  If you were given a shoulder sling or immobilizer: ? Wear it as told. ? Remove it to shower or bathe. ? Move your arm as little as possible, but keep your hand moving to prevent swelling.  Contact a health care provider if:  Your pain gets worse.  Your pain is not relieved with medicines.  New pain develops in your arm, hand, or fingers. Get help right away if:  Your arm, hand, or fingers: ? Tingle. ? Become numb. ? Become swollen. ? Become painful. ? Turn white or blue. This  information is not intended to replace advice given to you by your health care provider. Make sure you discuss any questions you have with your health care provider. Document Released: 01/02/2005 Document Revised: 11/19/2015 Document Reviewed: 07/18/2014 Elsevier Interactive Patient Education  2017 [employer]    IF you received an x-ray today, you will receive an invoice from [facility]. Please contact [facility] at [phone] with questions or concerns regarding your invoice.   IF you received labwork today, you will receive an invoice from [facility]. Please contact [facility] at [phone] with questions or concerns regarding your invoice.   Our billing staff will not be able to assist you with questions regarding bills from these companies.  You will be contacted with the lab results as soon as they are available. The fastest way to get your results is to activate your My Chart account. Instructions are located on the last page of this paperwork. If you have not heard from us regarding the results in 2 weeks, please contact this office.

## 2017-03-26 ENCOUNTER — Encounter: Payer: Self-pay | Admitting: Family Medicine

## 2017-05-19 ENCOUNTER — Other Ambulatory Visit: Payer: Self-pay | Admitting: Family Medicine

## 2017-05-19 DIAGNOSIS — F418 Other specified anxiety disorders: Secondary | ICD-10-CM

## 2017-06-09 ENCOUNTER — Other Ambulatory Visit: Payer: Self-pay | Admitting: Family Medicine

## 2017-06-09 DIAGNOSIS — F418 Other specified anxiety disorders: Secondary | ICD-10-CM

## 2017-06-11 NOTE — Telephone Encounter (Signed)
Additional 30 day supply was sent in, however when discussed in December, she had planned on follow-up with psychiatry.   Does she have an appointment with psychiatry? If not, may need to refer her again, or follow-up to discuss further options. Again I did send in a 30 day supply so she does not run out during that time.

## 2017-06-24 ENCOUNTER — Ambulatory Visit (INDEPENDENT_AMBULATORY_CARE_PROVIDER_SITE_OTHER): Payer: PRIVATE HEALTH INSURANCE | Admitting: Family Medicine

## 2017-06-24 ENCOUNTER — Encounter: Payer: Self-pay | Admitting: Family Medicine

## 2017-06-24 ENCOUNTER — Ambulatory Visit (INDEPENDENT_AMBULATORY_CARE_PROVIDER_SITE_OTHER): Payer: PRIVATE HEALTH INSURANCE

## 2017-06-24 ENCOUNTER — Other Ambulatory Visit: Payer: Self-pay | Admitting: Family Medicine

## 2017-06-24 VITALS — BP 110/69 | HR 107 | Temp 98.5°F | Resp 17

## 2017-06-24 DIAGNOSIS — G90A Postural orthostatic tachycardia syndrome (POTS): Secondary | ICD-10-CM

## 2017-06-24 DIAGNOSIS — M62838 Other muscle spasm: Secondary | ICD-10-CM | POA: Diagnosis not present

## 2017-06-24 DIAGNOSIS — F418 Other specified anxiety disorders: Secondary | ICD-10-CM

## 2017-06-24 DIAGNOSIS — M792 Neuralgia and neuritis, unspecified: Secondary | ICD-10-CM

## 2017-06-24 DIAGNOSIS — I951 Orthostatic hypotension: Secondary | ICD-10-CM | POA: Diagnosis not present

## 2017-06-24 DIAGNOSIS — M25511 Pain in right shoulder: Secondary | ICD-10-CM | POA: Diagnosis not present

## 2017-06-24 DIAGNOSIS — F909 Attention-deficit hyperactivity disorder, unspecified type: Secondary | ICD-10-CM

## 2017-06-24 DIAGNOSIS — R000 Tachycardia, unspecified: Secondary | ICD-10-CM | POA: Diagnosis not present

## 2017-06-24 MED ORDER — LORAZEPAM 0.5 MG PO TABS: 0.5000 mg | ORAL_TABLET | Freq: Every day | ORAL | 0 refills | Status: DC | PRN

## 2017-06-24 MED ORDER — CYCLOBENZAPRINE HCL 5 MG PO TABS: ORAL_TABLET | ORAL | 0 refills | Status: DC

## 2017-06-24 MED ORDER — AMPHETAMINE-DEXTROAMPHETAMINE 15 MG PO TABS: 15.0000 mg | ORAL_TABLET | Freq: Two times a day (BID) | ORAL | 0 refills | Status: DC

## 2017-06-24 NOTE — Progress Notes (Signed)
Subjective:  By signing my name below, I, Stann Ore, attest that this documentation has been prepared under the direction and in the presence of Meredith Staggers, MD. Electronically Signed: Stann Ore, Scribe. 06/24/2017 , 3:56 PM .  Patient was seen in Room 2 .   Patient ID: Maria Bond, adult    DOB: 1990-09-05, 27 y.o.   MRN: [id number] Chief Complaint  Patient presents with  . Medication Refill    ADDERALL  . Shoulder Pain   HPI Maria Bond is a 27 y.o. adult  Here for follow up of depression/anxiety. Her initial visit with me was in Dec 2018. She was previously followed in [place], [place] and her psychiatry at that time. She has a history of anxiety, autism spectrum, and ADHD. At last visit, she was on Lexapro 20mg  QD, Buspar 7.5mg  BID, Ativan 0.5mg  every other day, and Adderall 15mg  BID.   Plan for her on Dec 17th visit to refer to psychiatry with temporary medication refill until she can see psychiatry. Her records were requested from previous providers. Her refills completed on Feb 11th for her Buspar and March 4th for her Lexapro.   She tried to schedule with psychiatry, Dr. Jannifer [facility] at [facility], but hasn't received call back yet.   She's been in [place] for about 2 years now.   Right shoulder pain See last visit. She had complained of right shoulder pain for 3 years. She had reported a fall with her right shoulder above her head about 4 years ago, but states range of motion has improved over the years. She had initially taken muscle relaxant and was then treating with ibuprofen 400mg  1-2 times per day at last visit. Her ROM was limited by approximately 45-55 degrees abduction at last visit. Thought to have some impingement versus early adhesive capsulitis, planned on outside records and follow up in a few weeks when seen in Dec 2018.   Patient states she's been taking ibuprofen and naproxen, as well as 1 tablet of percocet from family. She has  not seen any other provider recently. She reports pain worsening with repetitive movements and noticed pain going down to her fingers with spasm in upper arm. She describes pain down her arm as pulsating last week after driving for about 3 hours over the weekend. She's been applying ice pack over the area as well, denies combining the NSAID's (ibuprofen and naproxen). She denies blood in stool or ulcers in the past. She denies surgery done. She received 2 cortisone injections in the past, last done over 2 years ago. She hasn't seen an orthopedists for this. She denies xray done of her shoulders.   Medications She's still taking Ativan every other day, and she's currently out of it. She's still taking Buspar twice a day, and refilled today.  She is out of Adderall today. Currently combination of medications is doing well for her. She usually has a dip around winter, so her mood has been pulling up with spring time rolling around.   Depression screen [facility] 2/9 06/24/2017 03/24/2017  Decreased Interest 0 2  Down, Depressed, Hopeless 0 2  PHQ - 2 Score 0 4  Altered sleeping - 2  Tired, decreased energy - 0  Change in appetite - 3  Feeling bad or failure about yourself  - 3  Trouble concentrating - 0  Moving slowly or fidgety/restless - 0  Suicidal thoughts - 0  PHQ-9 Score - 12   She denies any suicidal thoughts.  Postural tachycardia/POTS She is also taking propranolol 10mg  BID for postural tachycardia, which has affected her mobility. She's been using a wheelchair when outside of her home. She was working with a Herbalistvocational specialist at last visit.   She mentions followed by cardiology, Dr. Alto DenverHazen of [facility], in the past in [place]. She had Holter monitor and echo done in the past, without any concerns. She has not seen a cardiology here in [place].   Patient Active Problem List   Diagnosis Date Noted  . Depression 04/27/2014  . Asperger's disorder 12/06/2011  . Anxiety 12/06/2011    Past Medical History:  Diagnosis Date  . ADHD (attention deficit hyperactivity disorder)   . Allergy   . Anemia   . Anxiety   . Autistic disorder   . Depression   . Tachycardia    Past Surgical History:  Procedure Laterality Date  . gender reassignment     Allergies  Allergen Reactions  . Sulfa Antibiotics    Prior to Admission medications   Medication Sig Start Date End Date Taking? Authorizing Provider  amphetamine-dextroamphetamine (ADDERALL) 15 MG tablet Take 15 mg by mouth 2 (two) times daily.   Yes [provider]  busPIRone (BUSPAR) 7.5 MG tablet TAKE 1 TABLET(7.5 MG) BY MOUTH TWICE DAILY 06/24/17  Yes Shade FloodGreene, Corley Kohls R, MD  escitalopram (LEXAPRO) 20 MG tablet TAKE 1 TABLET(20 MG) BY MOUTH DAILY 06/11/17  Yes Shade FloodGreene, Seena Ritacco R, MD  estradiol (ESTRACE) 2 MG tablet Take 2 mg by mouth daily.   Yes [provider]  LORazepam (ATIVAN) 0.5 MG tablet Take 1 tablet (0.5 mg total) by mouth daily as needed for anxiety. 03/24/17  Yes Shade FloodGreene, Orvell Careaga R, MD  propranolol (INDERAL) 10 MG tablet Take 10 mg by mouth 2 (two) times daily.   Yes [provider]   Social History   Socioeconomic History  . Marital status: Single    Spouse name: Not on file  . Number of children: Not on file  . Years of education: Not on file  . Highest education level: Not on file  Social Needs  . Financial resource strain: Not on file  . Food insecurity - worry: Not on file  . Food insecurity - inability: Not on file  . Transportation needs - medical: Not on file  . Transportation needs - non-medical: Not on file  Occupational History  . Not on file  Tobacco Use  . Smoking status: Never Smoker  . Smokeless tobacco: Never Used  Substance and Sexual Activity  . Alcohol use: No  . Drug use: No  . Sexual activity: Not on file  Other Topics Concern  . Not on file  Social History Narrative  . Not on file   Review of Systems  Constitutional: Negative for chills, fatigue,  fever and unexpected weight change.  Respiratory: Negative for cough.   Gastrointestinal: Negative for constipation, diarrhea, nausea and vomiting.  Musculoskeletal: Positive for arthralgias.  Skin: Negative for rash and wound.  Neurological: Negative for dizziness, weakness and headaches.       Objective:   Physical Exam  Constitutional: She is oriented to person, place, and time. She appears well-developed and well-nourished. No distress.  HENT:  Head: Normocephalic and atraumatic.  Eyes: EOM are normal. Pupils are equal, round, and reactive to light.  Neck: Neck supple.  Cardiovascular: Normal rate.  Pulmonary/Chest: Effort normal. No respiratory distress.  Musculoskeletal: Normal range of motion.  Right shoulder: slight tenderness over upper trapezius, paraspinals and spine non  tender, lacking about 30-45 degrees of extension of cervical spine otherwise full ROM; some tingling down right arm with exam, but not with any specific movement; [place]/AC non tender, no focal tenderness right lateral shoulder, some guarding with internal rotation slightly decreased to thoracic spine on the right, lacking approximately 40-50 degrees of abduction; negative neer, negative hawkin, pain with empty can with slight weakness  Neurological: She is alert and oriented to person, place, and time.  Reflex Scores:      Tricep reflexes are 2+ on the right side and 2+ on the left side.      Bicep reflexes are 2+ on the right side and 2+ on the left side.      Brachioradialis reflexes are 2+ on the right side and 2+ on the left side. Skin: Skin is warm and dry.  Psychiatric: She has a normal mood and affect. Her behavior is normal.  Nursing note and vitals reviewed.   Vitals:   06/24/17 1447  BP: 110/69  Pulse: (!) 107  Resp: 17  Temp: 98.5 F (36.9 C)  TempSrc: Oral  SpO2: 98%   Dg Shoulder Right  Result Date: 06/24/2017 CLINICAL DATA:  R shoulder pain, longstanding with recent worsening. EXAM:  RIGHT SHOULDER - 2+ VIEW COMPARISON:  None. FINDINGS: There is no evidence of fracture or dislocation. There is no evidence of arthropathy or other focal bone abnormality. Soft tissues are unremarkable. IMPRESSION: Negative. Electronically Signed   By: Norva Pavlov M.D.   On: 06/24/2017 16:11       Assessment & Plan:   Maria Bond is a 27 y.o. adult Attention deficit hyperactivity disorder (ADHD), unspecified ADHD type - Plan: amphetamine-dextroamphetamine (ADDERALL) 15 MG tablet  -Stable, follow-up appointment with psychiatry pending.  Refilled same dose for now  Depression with anxiety - Plan: LORazepam (ATIVAN) 0.5 MG tablet  -Reports overall stable symptoms with Lexapro, BuSpar, Ativan as previously prescribed by psychiatry.  Agreed to refill for now, but also with need for Adderall stressed importance of psychiatry follow-up  Postural orthostatic tachycardia syndrome - Plan: Ambulatory referral to Cardiology  -Refer to cardiologist for ongoing follow-up, continue Inderal same dose for now  Pain in joint of right shoulder - Plan: DG Shoulder Right, Ambulatory referral to Orthopedic Surgery Radicular pain in right arm - Plan: Ambulatory referral to Orthopedic Surgery Trapezius muscle spasm - Plan: DG Shoulder Right, cyclobenzaprine (FLEXERIL) 5 MG tablet  -She appears to primarily have rotator cuff syndrome versus adhesive capsulitis, but also describes some trapezius spasm as well as radicular symptoms, that may be related to cervical spine.  Cervical spine exam was overall reassuring, as well as imaging of shoulder.  -Agreed to try Flexeril for now with side effects discussed, precautions on combination with other sedating medications  Ibuprofen as needed for now, refer to orthopedic surgery to decide next step including possible need for advanced imaging versus trial of physical therapy.   Meds ordered this encounter  Medications  . LORazepam (ATIVAN) 0.5 MG tablet    Sig:  Take 1 tablet (0.5 mg total) by mouth daily as needed for anxiety.    Dispense:  30 tablet    Refill:  0  . amphetamine-dextroamphetamine (ADDERALL) 15 MG tablet    Sig: Take 1 tablet by mouth 2 (two) times daily.    Dispense:  60 tablet    Refill:  0  . cyclobenzaprine (FLEXERIL) 5 MG tablet    Sig: 1 pill by mouth up to every 8 hours  as needed. Start with one pill by mouth each bedtime as needed due to sedation    Dispense:  15 tablet    Refill:  0   Patient Instructions   Continue to work on appointment with Dr. Jannifer Franklin.   Lexapro refilled on the 6th, and other meds refilled today. Let me know if you need other refills prior to psychiatry eval.   I referred you to cardiology for ongoing care with POTS. No change in propanolol dose for now.   Flexeril if needed for spasm or tightness in muscles around shoulder. Do not combine with other sedating meds. Ibuprofen up to every 6 hours with food. I will refer you to shoulder specialist for further evaluation, and will let you know if there are any concerning findings on your xray.   IF you received an x-ray today, you will receive an invoice from [facility]. Please contact [facility] at [phone] with questions or concerns regarding your invoice.   IF you received labwork today, you will receive an invoice from [facility]. Please contact [facility] at [phone] with questions or concerns regarding your invoice.   Our billing staff will not be able to assist you with questions regarding bills from these companies.  You will be contacted with the lab results as soon as they are available. The fastest way to get your results is to activate your My Chart account. Instructions are located on the last page of this paperwork. If you have not heard from [place] regarding the results in 2 weeks, please contact this office.       I personally performed the services described in this documentation, which was scribed in my  presence. The recorded information has been reviewed and considered for accuracy and completeness, addended by me as needed, and agree with information above.  Signed,   Meredith Staggers, MD Primary Care at [facility].  06/27/17 2:05 PM

## 2017-06-24 NOTE — Patient Instructions (Addendum)
Continue to work on appointment with Dr. Jannifer FranklinAkintayo.   Lexapro refilled on the 6th, and other meds refilled today. Let me know if you need other refills prior to psychiatry eval.   I referred you to cardiology for ongoing care with POTS. No change in propanolol dose for now.   Flexeril if needed for spasm or tightness in muscles around shoulder. Do not combine with other sedating meds. Ibuprofen up to every 6 hours with food. I will refer you to shoulder specialist for further evaluation, and will let you know if there are any concerning findings on your xray.   IF you received an x-ray today, you will receive an invoice from [facility]. Please contact [facility] at [phone] with questions or concerns regarding your invoice.   IF you received labwork today, you will receive an invoice from [facility]. Please contact [facility] at [phone] with questions or concerns regarding your invoice.   Our billing staff will not be able to assist you with questions regarding bills from these companies.  You will be contacted with the lab results as soon as they are available. The fastest way to get your results is to activate your My Chart account. Instructions are located on the last page of this paperwork. If you have not heard from us regarding the results in 2 weeks, please contact this office.

## 2017-07-16 ENCOUNTER — Telehealth: Payer: Self-pay | Admitting: Family Medicine

## 2017-07-16 ENCOUNTER — Other Ambulatory Visit: Payer: Self-pay | Admitting: Family Medicine

## 2017-07-16 DIAGNOSIS — F418 Other specified anxiety disorders: Secondary | ICD-10-CM

## 2017-07-16 NOTE — Telephone Encounter (Signed)
Copied from CRM [id number]. Topic: Quick Communication - See Telephone Encounter >> Jul 16, 2017 12:22 PM Rudi CocoLathan, Jameka Ivie M, [place] wrote: CRM for notification. See Telephone encounter for: 07/16/17.  Pt. Calling stating that  a pre auth. Is needed for escitalopram (LEXAPRO) 20 MG tablet [[id number]][[id number]]   [facility] [id number] [address] [address] [facility] [place] [address] Phone: [phone] Fax: [phone]

## 2017-07-16 NOTE — Telephone Encounter (Signed)
Prior Auth

## 2017-07-22 ENCOUNTER — Other Ambulatory Visit: Payer: Self-pay | Admitting: Family Medicine

## 2017-07-22 DIAGNOSIS — F909 Attention-deficit hyperactivity disorder, unspecified type: Secondary | ICD-10-CM

## 2017-07-22 NOTE — Telephone Encounter (Signed)
Copied from CRM [id number]. Topic: Quick Communication - Rx Refill/Question >> Jul 22, 2017 12:15 PM Rudi CocoLathan, Kaytee Taliercio M, [place] wrote: Medication: amphetamine-dextroamphetamine (ADDERALL) 15 MG tablet [[id number]][[id number]]  Has the patient contacted their pharmacy? yes (Agent: If no, request that the patient contact the pharmacy for the refill.) Preferred Pharmacy (with phone number or street name):[facility] [phone] [place]- [place], [place] - [address] AT [facility] [address] [facility] [address] [place] [address] Phone: [phone] Fax: [phone]   Agent: Please be advised that RX refills may take up to 3 business days. We ask that you follow-up with your pharmacy.

## 2017-07-22 NOTE — Telephone Encounter (Signed)
Refill of Adderall  LOV 06/24/17  Dr. Jamie BrookesGreene  [facility] [phone] - [place], [place] - [address] AT [facility] [address]

## 2017-07-24 ENCOUNTER — Telehealth: Payer: Self-pay

## 2017-07-24 NOTE — Telephone Encounter (Signed)
Called pharmacy and they are unsure whether medication needs prior auth because pt picked up rx with a discount card. Pharmacy stated they would fax request if prior auth was still needed on medication.

## 2017-07-24 NOTE — Telephone Encounter (Signed)
Copied from CRM [id number]. Topic: Quick Communication - Rx Refill/Question >> Jul 22, 2017 12:15 PM Rudi CocoLathan, Latoya M, [place] wrote: Medication: amphetamine-dextroamphetamine (ADDERALL) 15 MG tablet [[id number]][[id number]]  Has the patient contacted their pharmacy? yes (Agent: If no, request that the patient contact the pharmacy for the refill.) Preferred Pharmacy (with phone number or street name):[facility] [phone] [address]- [place], [place] - [address] AT [facility] [address] [facility] [address] [place] [address] Phone: [phone] Fax: [phone]   Agent: Please be advised that RX refills may take up to 3 business days. We ask that you follow-up with your pharmacy. >> Jul 24, 2017 11:47 AM Cipriano BunkerLambe, Annette S wrote: Pt. Checking on Adderall prescription .  Question if this needed PA.  Please call pt and let her know.  She will run out today

## 2017-07-25 ENCOUNTER — Other Ambulatory Visit: Payer: Self-pay

## 2017-07-25 MED ORDER — AMPHETAMINE-DEXTROAMPHETAMINE 15 MG PO TABS: 15.0000 mg | ORAL_TABLET | Freq: Two times a day (BID) | ORAL | 0 refills | Status: DC

## 2017-07-25 NOTE — Telephone Encounter (Signed)
Requesting refill on Adderrall

## 2017-07-25 NOTE — Telephone Encounter (Signed)
Some how I got Dr Paralee CancelGreene's reply. Therefore I am sending it back your way.

## 2017-07-25 NOTE — Telephone Encounter (Signed)
Pt has been waiting for this med for 3 day now. Pt said she is starting to have withdrawal . Pt said dr Neva Seatgreene is her PCP

## 2017-07-25 NOTE — Telephone Encounter (Signed)
Adderall refill sent to pharmacy.  However see prior note, what is the status of her appointment with psychiatry? Has she obtained an appointment yet?

## 2017-07-25 NOTE — Telephone Encounter (Signed)
Second request

## 2017-07-28 ENCOUNTER — Other Ambulatory Visit: Payer: Self-pay | Admitting: Family Medicine

## 2017-07-28 ENCOUNTER — Other Ambulatory Visit (HOSPITAL_COMMUNITY): Payer: Self-pay | Admitting: Orthopedic Surgery

## 2017-07-28 DIAGNOSIS — M25511 Pain in right shoulder: Secondary | ICD-10-CM

## 2017-07-28 DIAGNOSIS — F418 Other specified anxiety disorders: Secondary | ICD-10-CM

## 2017-07-28 NOTE — Telephone Encounter (Signed)
Please advise on refill.

## 2017-07-29 NOTE — Telephone Encounter (Signed)
Refilled. See prior note.

## 2017-07-31 ENCOUNTER — Ambulatory Visit (HOSPITAL_COMMUNITY)
Admission: RE | Admit: 2017-07-31 | Discharge: 2017-07-31 | Disposition: A | Discharge status: Home / Self Care | Payer: PRIVATE HEALTH INSURANCE | Source: Ambulatory Visit | Attending: Orthopedic Surgery | Admitting: Orthopedic Surgery

## 2017-07-31 ENCOUNTER — Encounter (HOSPITAL_COMMUNITY): Payer: Self-pay | Admitting: Radiology

## 2017-07-31 DIAGNOSIS — M25511 Pain in right shoulder: Secondary | ICD-10-CM | POA: Insufficient documentation

## 2017-08-04 NOTE — Telephone Encounter (Signed)
Pa submitted 4/29

## 2017-08-06 NOTE — Telephone Encounter (Signed)
I do not see where she has required the brand name in the past.  Please clarify with her if that is needed and for what reason as generic equivalents need to be tried first unless she had a documented reaction to those medications.  Can resend as generic Lexapro if no previous reaction or intolerance to generic medications known.

## 2017-08-06 NOTE — Telephone Encounter (Signed)
Incoming fax received from [facility] dated 08/05/17 with the following message:  "Brand Lexapro is denied for medical necessity. Medication authorization for a cost reduction exception requires the following:  1) You have tried two generic equivalents of the requested drug from different manufacturers AND one of the following-  -You have had an allergic reaction or intolerance to an inactive ingredient OR  -You have experienced an inadequate response to the generic equivalents of the requested drug."  Provider, FYI.

## 2017-08-11 ENCOUNTER — Other Ambulatory Visit: Payer: Self-pay | Admitting: Family Medicine

## 2017-08-11 DIAGNOSIS — F418 Other specified anxiety disorders: Secondary | ICD-10-CM

## 2017-08-14 NOTE — Telephone Encounter (Signed)
Pharmacy states that they are getting name rejection when trying to file insurance. Unable to start PA without initial submission. Advised pt to stop by pharmacy and give them insurance info so that we may proceed with starting PA on this medication.

## 2017-08-18 ENCOUNTER — Ambulatory Visit: Payer: PRIVATE HEALTH INSURANCE | Admitting: Interventional Cardiology

## 2017-08-19 ENCOUNTER — Other Ambulatory Visit: Payer: Self-pay | Admitting: Family Medicine

## 2017-08-19 DIAGNOSIS — F909 Attention-deficit hyperactivity disorder, unspecified type: Secondary | ICD-10-CM

## 2017-08-19 NOTE — Telephone Encounter (Signed)
Refill request for Adderall, last filled on 07/25/17 #60  LOV:06/24/17 Dr. Jamie Brookes  [address]

## 2017-08-19 NOTE — Telephone Encounter (Signed)
Copied from CRM [id number]. Topic: Quick Communication - Rx Refill/Question >> Aug 19, 2017  3:32 PM Mickel Baas B, NT wrote: Medication: amphetamine-dextroamphetamine (ADDERALL) 15 MG tablet Has the patient contacted their pharmacy? Yes.   (Agent: If no, request that the patient contact the pharmacy for the refill.) Preferred Pharmacy (with phone number or street name): [id number] - [place], [place] - [address] AT [facility] OF [address] Agent: Please be advised that RX refills may take up to 3 business days. We ask that you follow-up with your pharmacy.

## 2017-08-20 MED ORDER — AMPHETAMINE-DEXTROAMPHETAMINE 15 MG PO TABS: 15.0000 mg | ORAL_TABLET | Freq: Two times a day (BID) | ORAL | 0 refills | Status: DC

## 2017-08-20 NOTE — Telephone Encounter (Signed)
Patient is requesting a refill of the following medications: Requested Prescriptions   Pending Prescriptions Disp Refills  . amphetamine-dextroamphetamine (ADDERALL) 15 MG tablet 60 tablet 0    Sig: Take 1 tablet by mouth 2 (two) times daily.    Date of patient request: 08/19/2017 Last office visit: 06/24/2017 Date of last refill:07/25/2017 Last refill amount: 60 Follow up time period per chart: refill in 60 days  Please advise. Dgaddy, CMA

## 2017-08-20 NOTE — Telephone Encounter (Signed)
Adderall refilled, but check status of psychiatry follow-up appointment.  Thanks.

## 2017-08-21 ENCOUNTER — Other Ambulatory Visit: Payer: Self-pay | Admitting: Family Medicine

## 2017-08-21 DIAGNOSIS — F418 Other specified anxiety disorders: Secondary | ICD-10-CM

## 2017-08-21 NOTE — Telephone Encounter (Signed)
Please advise on refill below.   Pt was last seen on 06/24/17

## 2017-08-21 NOTE — Telephone Encounter (Signed)
Refill request for Lorazepam, last filled on 06/24/17 #30  LOV:06/24/17 Dr. Neva Seat

## 2017-08-22 NOTE — Telephone Encounter (Signed)
Refilled, but see last refill request.  Please check status of psychiatry follow-up.  Let me know when she has an appointment.  Thanks

## 2017-08-22 NOTE — Telephone Encounter (Signed)
Patient called and advised of the note from Dr. Neva Seat on 08/22/17. She says "I have not heard from the psychiatrist office, so I will call my old psychiatrist office on Monday and schedule an appointment. I will see him until I find one closer."

## 2017-08-23 ENCOUNTER — Other Ambulatory Visit: Payer: Self-pay | Admitting: Family Medicine

## 2017-08-23 DIAGNOSIS — F418 Other specified anxiety disorders: Secondary | ICD-10-CM

## 2017-08-23 NOTE — Telephone Encounter (Signed)
Patient is requesting a refill of the following medications: Requested Prescriptions   Pending Prescriptions Disp Refills  . busPIRone (BUSPAR) 7.5 MG tablet [Pharmacy Med Name: BUSPIRONE 7.5MG  TABLETS] 60 tablet 0    Sig: TAKE 1 TABLET(7.5 MG) BY MOUTH TWICE DAILY    Date of patient request: 08/23/2017 Last office visit: 06/24/2017 Date of last refill: 07/29/2017 Last refill amount: 60 tab Follow up time period per chart: none

## 2017-08-28 ENCOUNTER — Telehealth: Payer: Self-pay

## 2017-08-28 NOTE — Telephone Encounter (Signed)
Contacted patient to inquire about records from past Cardiologist as I was unable to locate anything in Epic. Pt was referred to Dr. Graciela Husbands by PCP Dr. Neva Seat for POTS. Dr. Neva Seat mentioned that patient had an echo and holter monitor in the past from Dr. Alto Denver at [facility]. Will have patient come and sign a records release form to obtain all records from Dr. Alto Denver. Pt is aware and agreeable to coming to sign record release, it will be at the front desk. Fax to Dr. Lanae Crumbly office at [phone]

## 2017-09-03 NOTE — Telephone Encounter (Addendum)
Contacted patient to inquire whether she came to sign the records release form that was left for her last week, she had not as of this morning but stated that she would today so that we could obtain her records for her appt tomorrow with Dr. Graciela Husbands

## 2017-09-04 ENCOUNTER — Encounter (INDEPENDENT_AMBULATORY_CARE_PROVIDER_SITE_OTHER): Payer: Self-pay

## 2017-09-04 ENCOUNTER — Encounter: Payer: Self-pay | Admitting: Internal Medicine

## 2017-09-04 ENCOUNTER — Ambulatory Visit (INDEPENDENT_AMBULATORY_CARE_PROVIDER_SITE_OTHER): Payer: PRIVATE HEALTH INSURANCE | Admitting: Internal Medicine

## 2017-09-04 VITALS — BP 108/78 | HR 78 | Ht 65.0 in | Wt 120.0 lb

## 2017-09-04 DIAGNOSIS — R000 Tachycardia, unspecified: Secondary | ICD-10-CM | POA: Diagnosis not present

## 2017-09-04 DIAGNOSIS — I951 Orthostatic hypotension: Secondary | ICD-10-CM

## 2017-09-04 DIAGNOSIS — G90A Postural orthostatic tachycardia syndrome (POTS): Secondary | ICD-10-CM

## 2017-09-04 NOTE — Patient Instructions (Signed)
Medication Instructions:  Your physician has recommended you make the following change in your medication:   Begin a Sodium Supplement: Thermatabs, NUUN, Vytassium   Labwork: None ordered.  Testing/Procedures: None ordered.  Follow-Up: Your physician recommends that you schedule a follow-up appointment in:  3 months with Dr Graciela Husbands   Any Other Special Instructions Will Be Listed Below (If Applicable).  1- Keep your Head of Bed at least 6 in up (double pillow) 2- Begin Sodium Supplement 3- Recumbent Exercise 4- Begin wearing compression-wear. Ex: Spanx, Thigh Sleeves (Body Helix), Abdominal Bider   If you need a refill on your cardiac medications before your next appointment, please call your pharmacy.

## 2017-09-04 NOTE — Progress Notes (Signed)
ELECTROPHYSIOLOGY CONSULT NOTE  Patient ID: Maria Bond, MRN: [id number], DOB/AGE: 26/19/1992 26 y.o. Admit date: (Not on file) Date of Consult: 09/04/2017  Primary Physician: Maria Flood, MD Primary Cardiologist: Maria Bond is a 27 y.o. adult who is being seen today for the evaluation of POTS at the request of  Dr Maria Bond.    HPI Maria Bond is a 27 y.o. 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  S/he has long hx of psychiatric disease, incl anxiety, ADHD, depression and autism.  Also long-standing history of IBS.  Interestingly, with the initiation of Adderall, problems with constipation.  Symptoms began with shower intolerance and exercise intolerance.  Holter monitoring demonstrated heart rates in the 120s.  Prior to this her/his PCP had made a diagnosis of POTS but not sure how the dx was made.  Echo was normal  Diet is fluid deplete, salt depleted.  S/he moved to [place] to start school for airplane mechanics.  Ultimately this had to be discontinued because of heat intolerance.  S/he has become increasingly sedentary.  11-12 hours in bed in many many hours now sitting on the sofa.  Out of the house twice a week for therapy.  Showers every other day.  Has had standing intolerance.  Heat intolerance.  No menses issues  Has not seen cardiology in more than 3 years     Past Medical History:  Diagnosis Date  . ADHD (attention deficit hyperactivity disorder)   . Allergy   . Anemia   . Anxiety   . Autistic disorder   . Depression   . Tachycardia       Surgical History:  Past Surgical History:  Procedure Laterality Date  . gender reassignment       Home Meds: Prior to Admission medications   Medication Sig Start Date End Date Taking? Authorizing Provider  amphetamine-dextroamphetamine (ADDERALL) 15 MG tablet Take 1 tablet by mouth 2 (two) times daily. 08/20/17  Yes Maria Flood, MD  busPIRone (BUSPAR) 7.5  MG tablet TAKE 1 TABLET(7.5 MG) BY MOUTH TWICE DAILY 08/24/17  Yes Maria Flood, MD  cyclobenzaprine (FLEXERIL) 5 MG tablet 1 pill by mouth up to every 8 hours as needed. Start with one pill by mouth each bedtime as needed due to sedation 06/24/17  Yes Maria Flood, MD  escitalopram (LEXAPRO) 20 MG tablet TAKE 1 TABLET(20 MG) BY MOUTH DAILY 08/11/17  Yes Maria Flood, MD  estradiol (ESTRACE) 2 MG tablet Take 2 mg by mouth daily.   Yes [provider]  LORazepam (ATIVAN) 0.5 MG tablet TAKE 1 TABLET(0.5 MG) BY MOUTH DAILY AS NEEDED FOR ANXIETY 08/22/17  Yes Maria Flood, MD  propranolol (INDERAL) 10 MG tablet Take 10 mg by mouth 2 (two) times daily.   Yes [provider]    Allergies:  Allergies  Allergen Reactions  . Sulfa Antibiotics     Social History   Socioeconomic History  . Marital status: Single    Spouse name: Not on file  . Number of children: Not on file  . Years of education: Not on file  . Highest education level: Not on file  Occupational History  . Not on file  Social Needs  . Financial resource strain: Not on file  . Food insecurity:    Worry: Not on file    Inability: Not on file  . Transportation needs:  Medical: Not on file    Non-medical: Not on file  Tobacco Use  . Smoking status: Never Smoker  . Smokeless tobacco: Never Used  Substance and Sexual Activity  . Alcohol use: No  . Drug use: No  . Sexual activity: Not on file  Lifestyle  . Physical activity:    Days per week: Not on file    Minutes per session: Not on file  . Stress: Not on file  Relationships  . Social connections:    Talks on phone: Not on file    Gets together: Not on file    Attends religious service: Not on file    Active member of club or organization: Not on file    Attends meetings of clubs or organizations: Not on file    Relationship status: Not on file  . Intimate partner violence:    Fear of current or ex partner: Not on file     Emotionally abused: Not on file    Physically abused: Not on file    Forced sexual activity: Not on file  Other Topics Concern  . Not on file  Social History Narrative  . Not on file     Family History  Problem Relation Age of Onset  . Healthy Mother   . Stroke Paternal Grandmother      ROS:  Please see the history of present illness.     All other systems reviewed and negative.    Physical Exam: Blood pressure 108/78, pulse 78, height  (1.651 m), weight 120 lb (54.4 kg), SpO2 98 %. General: Well developed, well nourished adult in no acute distress. Head: Normocephalic, atraumatic, sclera non-icteric, no xanthomas, nares are without discharge. EENT: normal  Lymph Nodes:  none Neck: Negative for carotid bruits. JVD not elevated. Back:without scoliosis kyphosis Lungs: Clear bilaterally to auscultation without wheezes, rales, or rhonchi. Breathing is unlabored. Heart: RRR with S1 S2. No  murmur . No rubs, or gallops appreciated. Abdomen: Soft, non-tender, non-distended with normoactive bowel sounds. No hepatomegaly. No rebound/guarding. No obvious abdominal masses. Msk:  Strength and tone appear normal for age. Extremities: No clubbing or cyanosis. No edema.  Distal pedal pulses are 2+ and equal bilaterally. Skin: Warm and Dry Neuro: Alert and oriented X 3. CN III-XII intact Grossly normal sensory and motor function .  Sitting in a wheelchair Psych:  Responds to questions appropriately with a flat eye contact and almost no eye contact.  There was eye contact with the soap bottle.      Labs: Cardiac Enzymes No results for input(s): CKTOTAL, CKMB, TROPONINI in the last 72 hours. CBC No results found for: WBC, HGB, HCT, MCV, PLT PROTIME: No results for input(s): LABPROT, INR in the last 72 hours. Chemistry No results for input(s): NA, K, CL, CO2, BUN, CREATININE, CALCIUM, PROT, BILITOT, ALKPHOS, ALT, AST, GLUCOSE in the last 168 hours.  Invalid input(s):  LABALBU Lipids No results found for: CHOL, HDL, LDLCALC, TRIG BNP No results found for: PROBNP Thyroid Function Tests: No results for input(s): TSH, T4TOTAL, T3FREE, THYROIDAB in the last 72 hours.  Invalid input(s): FREET3 Miscellaneous No results found for: DDIMER  Radiology/Studies:  No results found.  EKG: Sinus rhythm at 78 Intervals 13/09/38 Otherwise normal   Assessment and Plan:  ADHD  Depression/anxiety  Debility  Orthostatic/heat intolerance  The patient has had progressive disability over the last couple of years and over the last couple months significantly aggravated by bedrest and inactivity.  S/he does not qualify  with a definition of POTS but not clear whether previously this was the case; does not recall ever having undergone tilt table testing or prolonged orthostatics prior to today.  We discussed extensively the issues of dysautonomia, the physiology of orthstasis and positional stress.  We discussed the role of salt and water repletion, the importance of exercise, often needing to be started in the recumbent position, and the awareness of triggers and the role of ambient heat and dehydration.  Have given her the name of different salt supplements, and suggested the use of spanks and/or thigh sleeves  We also discussed the importance of ongoing psychiatric/soul care.  S/he has been a swimmer in the past and there is a swimming pool at the apartment complex. More than 50% of 85 min was spent in counseling related to the above        Sherryl Manges

## 2017-09-09 ENCOUNTER — Other Ambulatory Visit: Payer: Self-pay | Admitting: Family Medicine

## 2017-09-09 DIAGNOSIS — F418 Other specified anxiety disorders: Secondary | ICD-10-CM

## 2017-09-09 NOTE — Telephone Encounter (Signed)
Please advise on refill.   Pt was last seen 06/24/17  Plan:  "-Reports overall stable symptoms with Lexapro, BuSpar, Ativan as previously prescribed by psychiatry.  Agreed to refill for now, but also with need for Adderall stressed importance of psychiatry follow-up"

## 2017-09-11 ENCOUNTER — Other Ambulatory Visit: Payer: Self-pay | Admitting: *Deleted

## 2017-09-11 NOTE — Telephone Encounter (Signed)
Patient called to request refills on propranolol by Dr Graciela HusbandsKlein. This was prescribed by her former cardiologist. Molli Knockkay to refill? Please advise. Thanks, MI

## 2017-09-12 MED ORDER — PROPRANOLOL HCL 10 MG PO TABS: 10.0000 mg | ORAL_TABLET | Freq: Two times a day (BID) | ORAL | 3 refills | Status: DC

## 2017-09-17 ENCOUNTER — Other Ambulatory Visit: Payer: Self-pay | Admitting: Family Medicine

## 2017-09-17 DIAGNOSIS — F909 Attention-deficit hyperactivity disorder, unspecified type: Secondary | ICD-10-CM

## 2017-09-17 NOTE — Telephone Encounter (Signed)
Copied from CRM [phone]. Topic: Quick Communication - Rx Refill/Question >> Sep 17, 2017  5:25 PM [name], [name] wrote: Medication: amphetamine-dextroamphetamine (ADDERALL) 15 MG tablet  Has the patient contacted their pharmacy? Yes (Agent: If no, request that the patient contact the pharmacy for the refill.) (Agent: If yes, when and what did the pharmacy advise?)  Preferred Pharmacy (with phone number or street name): [facility] [phone] - [place], [place] - [address] AT [facility] [address]  Agent: Please be advised that RX refills may take up to 3 business days. We ask that you follow-up with your pharmacy.

## 2017-09-18 NOTE — Telephone Encounter (Signed)
It appears from refill note in May that she planned on following up with previous psychiatrist until new appt locally. When is either psychiatrist appointment?

## 2017-09-18 NOTE — Telephone Encounter (Signed)
Please advise on refill.  Last seen 06/24/2017  Plan:Attention deficit hyperactivity disorder (ADHD), unspecified ADHD type - Plan: amphetamine-dextroamphetamine (ADDERALL) 15 MG tablet             -Stable, follow-up appointment with psychiatry pending.  Refilled same dose for now  Last filled 08/20/2017

## 2017-09-18 NOTE — Telephone Encounter (Signed)
Rx refill request: Adderall 15 mg      Last filled: 08/20/17 #60  LOV: 06/24/17  PCP: Neva SeatGreene  Pharmacy: verified

## 2017-09-19 NOTE — Telephone Encounter (Signed)
FYI to provider (please advise on message below)

## 2017-09-19 NOTE — Telephone Encounter (Signed)
Called patient back to check on her appointment with her psychiatrist. She stated that she is still trying to get an appointment. She ran out of her adderall  15 mg tab on Monday.

## 2017-09-20 MED ORDER — AMPHETAMINE-DEXTROAMPHETAMINE 15 MG PO TABS: 15.0000 mg | ORAL_TABLET | Freq: Two times a day (BID) | ORAL | 0 refills | Status: DC

## 2017-09-20 NOTE — Telephone Encounter (Signed)
Refilled for one more month, but will need to know appointment details with either her prior psychiatrist or new one before further refills.

## 2017-09-24 ENCOUNTER — Other Ambulatory Visit: Payer: Self-pay | Admitting: Family Medicine

## 2017-09-24 DIAGNOSIS — F418 Other specified anxiety disorders: Secondary | ICD-10-CM

## 2017-10-07 ENCOUNTER — Ambulatory Visit: Payer: PRIVATE HEALTH INSURANCE | Admitting: Interventional Cardiology

## 2017-10-10 ENCOUNTER — Other Ambulatory Visit: Payer: Self-pay | Admitting: Family Medicine

## 2017-10-10 DIAGNOSIS — F418 Other specified anxiety disorders: Secondary | ICD-10-CM

## 2017-10-12 NOTE — Telephone Encounter (Signed)
Med refill req Lexapro sent to Dr. Neva SeatGreene

## 2017-10-14 NOTE — Telephone Encounter (Signed)
Refilled lexapro, but please verify when she is scheduled to see psychiatry.

## 2017-10-20 ENCOUNTER — Other Ambulatory Visit: Payer: Self-pay

## 2017-10-20 ENCOUNTER — Encounter: Payer: Self-pay | Admitting: Family Medicine

## 2017-10-20 ENCOUNTER — Ambulatory Visit (INDEPENDENT_AMBULATORY_CARE_PROVIDER_SITE_OTHER): Payer: PRIVATE HEALTH INSURANCE | Admitting: Family Medicine

## 2017-10-20 ENCOUNTER — Ambulatory Visit (INDEPENDENT_AMBULATORY_CARE_PROVIDER_SITE_OTHER): Payer: PRIVATE HEALTH INSURANCE

## 2017-10-20 VITALS — BP 124/77 | HR 85 | Temp 98.7°F | Resp 16 | Ht 65.0 in

## 2017-10-20 DIAGNOSIS — R5382 Chronic fatigue, unspecified: Secondary | ICD-10-CM | POA: Diagnosis not present

## 2017-10-20 DIAGNOSIS — F640 Transsexualism: Secondary | ICD-10-CM

## 2017-10-20 DIAGNOSIS — M5412 Radiculopathy, cervical region: Secondary | ICD-10-CM | POA: Diagnosis not present

## 2017-10-20 DIAGNOSIS — F909 Attention-deficit hyperactivity disorder, unspecified type: Secondary | ICD-10-CM | POA: Diagnosis not present

## 2017-10-20 DIAGNOSIS — F418 Other specified anxiety disorders: Secondary | ICD-10-CM

## 2017-10-20 DIAGNOSIS — M62838 Other muscle spasm: Secondary | ICD-10-CM | POA: Diagnosis not present

## 2017-10-20 DIAGNOSIS — Z79899 Other long term (current) drug therapy: Secondary | ICD-10-CM

## 2017-10-20 DIAGNOSIS — Z789 Other specified health status: Secondary | ICD-10-CM

## 2017-10-20 DIAGNOSIS — M249 Joint derangement, unspecified: Secondary | ICD-10-CM

## 2017-10-20 MED ORDER — MELOXICAM 15 MG PO TABS: 15.0000 mg | ORAL_TABLET | Freq: Every day | ORAL | 1 refills | Status: DC

## 2017-10-20 MED ORDER — CYCLOBENZAPRINE HCL 5 MG PO TABS: ORAL_TABLET | ORAL | 1 refills | Status: DC

## 2017-10-20 MED ORDER — BUSPIRONE HCL 7.5 MG PO TABS: ORAL_TABLET | ORAL | 0 refills | Status: DC

## 2017-10-20 MED ORDER — ESTRADIOL 2 MG PO TABS: 2.0000 mg | ORAL_TABLET | Freq: Every day | ORAL | 3 refills | Status: DC

## 2017-10-20 MED ORDER — PREDNISONE 20 MG PO TABS: ORAL_TABLET | ORAL | 0 refills | Status: DC

## 2017-10-20 MED ORDER — LORAZEPAM 0.5 MG PO TABS: ORAL_TABLET | ORAL | 0 refills | Status: DC

## 2017-10-20 MED ORDER — AMPHETAMINE-DEXTROAMPHETAMINE 15 MG PO TABS: 15.0000 mg | ORAL_TABLET | Freq: Two times a day (BID) | ORAL | 0 refills | Status: DC

## 2017-10-20 NOTE — Patient Instructions (Addendum)
 IF you received an x-ray today, you will receive an invoice from [facility]. Please contact [facility] at [phone] with questions or concerns regarding your invoice.   IF you received labwork today, you will receive an invoice from [facility]. Please contact [facility] at [phone] with questions or concerns regarding your invoice.   Our billing staff will not be able to assist you with questions regarding bills from these companies.  You will be contacted with the lab results as soon as they are available. The fastest way to get your results is to activate your My Chart account. Instructions are located on the last page of this paperwork. If you have not heard from us regarding the results in 2 weeks, please contact this office.      Cervical Radiculopathy Cervical radiculopathy happens when a nerve in the neck (cervical nerve) is pinched or bruised. This condition can develop because of an injury or as part of the normal aging process. Pressure on the cervical nerves can cause pain or numbness that runs from the neck all the way down into the arm and fingers. Usually, this condition gets better with rest. Treatment may be needed if the condition does not improve. What are the causes? This condition may be caused by:  Injury.  Slipped (herniated) disk.  Muscle tightness in the neck because of overuse.  Arthritis.  Breakdown or degeneration in the bones and joints of the spine (spondylosis) due to aging.  Bone spurs that may develop near the cervical nerves.  What are the signs or symptoms? Symptoms of this condition include:  Pain that runs from the neck to the arm and hand. The pain can be severe or irritating. It may be worse when the neck is moved.  Numbness or weakness in the affected arm and hand.  How is this diagnosed? This condition may be diagnosed based on symptoms, medical history, and a physical exam. You may also have tests,  including:  X-rays.  CT scan.  MRI.  Electromyogram (EMG).  Nerve conduction tests.  How is this treated? In many cases, treatment is not needed for this condition. With rest, the condition usually gets better over time. If treatment is needed, options may include:  Wearing a soft neck collar for short periods of time.  Physical therapy to strengthen your neck muscles.  Medicines, such as NSAIDs, oral corticosteroids, or spinal injections.  Surgery. This may be needed if other treatments do not help. Various types of surgery may be done depending on the cause of your problems.  Follow these instructions at home: Managing pain  Take over-the-counter and prescription medicines only as told by your health care provider.  If directed, apply ice to the affected area. ? Put ice in a plastic bag. ? Place a towel between your skin and the bag. ? Leave the ice on for 20 minutes, 2-3 times per day.  If ice does not help, you can try using heat. Take a warm shower or warm bath, or use a heat pack as told by your health care provider.  Try a gentle neck and shoulder massage to help relieve symptoms. Activity  Rest as needed. Follow instructions from your health care provider about any restrictions on activities.  Do stretching and strengthening exercises as told by your health care provider or physical therapist. General instructions  If you were given a soft collar, wear it as told by your health care provider.  Use a flat pillow when you sleep.    Keep all follow-up visits as told by your health care provider. This is important. Contact a health care provider if:  Your condition does not improve with treatment. Get help right away if:  Your pain gets much worse and cannot be controlled with medicines.  You have weakness or numbness in your hand, arm, face, or leg.  You have a high fever.  You have a stiff, rigid neck.  You lose control of your bowels or your bladder  (have incontinence).  You have trouble with walking, balance, or speaking. This information is not intended to replace advice given to you by your health care provider. Make sure you discuss any questions you have with your health care provider. Document Released: 12/18/2000 Document Revised: 08/31/2015 Document Reviewed: 05/19/2014 Elsevier Interactive Patient Education  2018 [employer] Inc[employer]  

## 2017-10-20 NOTE — Progress Notes (Deleted)
By signing my name below, I, Greer EeAshley Banegas, attest that this documentation has been prepared under the directions and in the prescence of Dr. Levell JulyEva N. Clelia CroftShaw. Electronically Signed: Greer EeAshley Banegas, Medical Scribe 10/20/17 at 5:30 pm.  Subjective:    Patient ID: Maria Bond is a 27 y.o. adult.  Chief Complaint:  Chief Complaint  Patient presents with  . Medication Refill    adderall, pt has a appt on the 22nd, she would like enough til appt. estradiol  . hormone therapy    pt would like hormone checked/ bloodwork  . Follow-up    chrominc conditons, pain in arm shoulder and neck      Past Medical History:  Diagnosis Date  . ADHD (attention deficit hyperactivity disorder)   . Allergy   . Anemia   . Anxiety   . Autistic disorder   . Depression   . Tachycardia     Past Surgical History:  Procedure Laterality Date  . gender reassignment      Family History  Problem Relation Age of Onset  . Healthy Mother   . Stroke Paternal Grandmother     Social History   Tobacco Use  . Smoking status: Never Smoker  . Smokeless tobacco: Never Used  Substance Use Topics  . Alcohol use: No  . Drug use: No    Review of Systems  Constitutional: Negative for fever and unexpected weight change.       She reports having lower energy levels since surgery.  HENT: Negative.   Eyes: Negative.   Respiratory: Negative.   Cardiovascular: Negative.   Gastrointestinal: Negative.   Endocrine: Negative.   Skin: Negative.   Allergic/Immunologic: Negative.   Neurological:       She gets lightheaded when she walks over 80 feet.  Hematological: Negative for adenopathy.  Psychiatric/Behavioral: Negative.     Objective:   BP 124/77   Pulse 85   Temp 98.7 F (37.1 C) (Oral)   Resp 16   Ht 5\' 5"  (1.651 m)   SpO2 98%   BMI 19.97 kg/m   Physical Exam  Constitutional: She is oriented to person, place, and time. She appears well-developed and well-nourished.  Neck:    Extension is  limited. Full lateral flexion to left and moderately restricted to the right.  No paraspinal masses, neck is supple.  Musculoskeletal:       Right elbow: She exhibits normal range of motion.       Arms: Patient has been on a wheelchair for about 2 years. Reports walking reliably 80 feet but feeling lightheaded and wobbly.  2+ on bicep and tricep on the right. 1+ bicep an brachalisin the left 2+ tricep. 4+ out of 5+ on the left and 5 out 5 on the right digits. 5 out of 5 wrist flexion and extension, 4 out 5 on the right bicep 5 out of 5 in the left bicep.  4+ out of 5 on the right deltoid.  Neurological: She is alert and oriented to person, place, and time.    Assessment:   Procedures  There were no encounter diagnoses.  Plan:

## 2017-10-24 LAB — CBC WITH DIFFERENTIAL/PLATELET
BASOS: 0 %
Basophils Absolute: 0 10*3/uL (ref 0.0–0.2)
EOS (ABSOLUTE): 0.2 10*3/uL (ref 0.0–0.4)
EOS: 2 %
HEMATOCRIT: 40.6 % (ref 34.0–46.6)
Hemoglobin: 13.2 g/dL (ref 11.1–15.9)
IMMATURE GRANULOCYTES: 0 %
Immature Grans (Abs): 0 10*3/uL (ref 0.0–0.1)
Lymphocytes Absolute: 2.9 10*3/uL (ref 0.7–3.1)
Lymphs: 33 %
MCH: 29.1 pg (ref 26.6–33.0)
MCHC: 32.5 g/dL (ref 31.5–35.7)
MCV: 90 fL (ref 79–97)
MONOS ABS: 0.7 10*3/uL (ref 0.1–0.9)
Monocytes: 8 %
NEUTROS ABS: 5 10*3/uL (ref 1.4–7.0)
Neutrophils: 57 %
PLATELETS: 329 10*3/uL (ref 150–450)
RBC: 4.53 x10E6/uL (ref 3.77–5.28)
RDW: 13.1 % (ref 12.3–15.4)
WBC: 8.9 10*3/uL (ref 3.4–10.8)

## 2017-10-24 LAB — COMPREHENSIVE METABOLIC PANEL
A/G RATIO: 1.9 (ref 1.2–2.2)
ALBUMIN: 4.8 g/dL (ref 3.5–5.5)
ALT: 12 IU/L (ref 0–32)
AST: 16 IU/L (ref 0–40)
Alkaline Phosphatase: 55 IU/L (ref 39–117)
BUN / CREAT RATIO: 20 (ref 9–23)
BUN: 13 mg/dL (ref 6–20)
Bilirubin Total: <0.2 mg/dL (ref 0.0–1.2)
CALCIUM: 9.4 mg/dL (ref 8.7–10.2)
CO2: 26 mmol/L (ref 20–29)
CREATININE: 0.65 mg/dL (ref 0.57–1.00)
Chloride: 104 mmol/L (ref 96–106)
GFR, EST AFRICAN AMERICAN: 143 mL/min/{1.73_m2} (ref >59)
GFR, EST NON AFRICAN AMERICAN: 124 mL/min/{1.73_m2} (ref >59)
GLOBULIN, TOTAL: 2.5 g/dL (ref 1.5–4.5)
Glucose: 89 mg/dL (ref 65–99)
POTASSIUM: 4.2 mmol/L (ref 3.5–5.2)
SODIUM: 142 mmol/L (ref 134–144)
TOTAL PROTEIN: 7.3 g/dL (ref 6.0–8.5)

## 2017-10-24 LAB — TESTT+TESTF+SHBG
Sex Hormone Binding: 115.7 nmol/L (ref 24.6–122.0)
Testosterone, Free: 2.5 pg/mL (ref 0.0–4.2)
Testosterone, total: 16.5 ng/dL (ref 10.0–55.0)

## 2017-10-24 LAB — TSH: TSH: 0.86 u[IU]/mL (ref 0.450–4.500)

## 2017-10-24 LAB — ESTRADIOL: ESTRADIOL: 46.3 pg/mL

## 2017-11-12 ENCOUNTER — Encounter: Payer: Self-pay | Admitting: Family Medicine

## 2017-11-12 ENCOUNTER — Other Ambulatory Visit: Payer: Self-pay

## 2017-11-12 ENCOUNTER — Ambulatory Visit (INDEPENDENT_AMBULATORY_CARE_PROVIDER_SITE_OTHER): Payer: PRIVATE HEALTH INSURANCE | Admitting: Family Medicine

## 2017-11-12 ENCOUNTER — Encounter

## 2017-11-12 VITALS — BP 106/70 | HR 89 | Temp 98.4°F | Resp 17 | Ht 65.0 in | Wt 120.6 lb

## 2017-11-12 DIAGNOSIS — R5382 Chronic fatigue, unspecified: Secondary | ICD-10-CM

## 2017-11-12 DIAGNOSIS — Z79899 Other long term (current) drug therapy: Secondary | ICD-10-CM

## 2017-11-12 DIAGNOSIS — R000 Tachycardia, unspecified: Secondary | ICD-10-CM | POA: Diagnosis not present

## 2017-11-12 DIAGNOSIS — M792 Neuralgia and neuritis, unspecified: Secondary | ICD-10-CM

## 2017-11-12 DIAGNOSIS — M5412 Radiculopathy, cervical region: Secondary | ICD-10-CM

## 2017-11-12 DIAGNOSIS — G90A Postural orthostatic tachycardia syndrome (POTS): Secondary | ICD-10-CM

## 2017-11-12 DIAGNOSIS — I951 Orthostatic hypotension: Secondary | ICD-10-CM

## 2017-11-12 DIAGNOSIS — G959 Disease of spinal cord, unspecified: Secondary | ICD-10-CM

## 2017-11-12 MED ORDER — HYDROCODONE-ACETAMINOPHEN 5-325 MG PO TABS: 1.0000 | ORAL_TABLET | ORAL | 0 refills | Status: DC | PRN

## 2017-11-12 MED ORDER — ESTRADIOL 2 MG PO TABS: 3.0000 mg | ORAL_TABLET | Freq: Every day | ORAL | 0 refills | Status: AC

## 2017-11-12 MED ORDER — ETODOLAC 500 MG PO TABS: 500.0000 mg | ORAL_TABLET | Freq: Two times a day (BID) | ORAL | 1 refills | Status: DC | PRN

## 2017-11-12 NOTE — Patient Instructions (Addendum)
     IF you received an x-ray today, you will receive an invoice from [facility]. Please contact [facility] at [phone] with questions or concerns regarding your invoice.   IF you received labwork today, you will receive an invoice from [facility]. Please contact [facility] at [phone] with questions or concerns regarding your invoice.   Our billing staff will not be able to assist you with questions regarding bills from these companies.  You will be contacted with the lab results as soon as they are available. The fastest way to get your results is to activate your My Chart account. Instructions are located on the last page of this paperwork. If you have not heard from us regarding the results in 2 weeks, please contact this office.     

## 2017-11-12 NOTE — Progress Notes (Signed)
Subjective:    Patient ID: Maria Bond, adult    DOB: 1990/09/23, 27 y.o.   MRN: [id number]   Chief Complaint  Patient presents with  . recheck radiculopathy    6 week f/u    HPI  Maria Bond is here today with her significant other Maria Bond (also my pt) to recheck on her upper extremity - still having sig pain   She is seeing Dr. Jannifer Franklin at [facility] - had visit 7/22 - consult note scanned into chart.   Past Medical History:  Diagnosis Date  . ADHD (attention deficit hyperactivity disorder)   . Allergy   . Anemia   . Anxiety   . Autistic disorder   . Depression   . Tachycardia    Past Surgical History:  Procedure Laterality Date  . VAGINOPLASTY  08/26/2015   Current Outpatient Medications on File Prior to Visit  Medication Sig Dispense Refill  . amphetamine-dextroamphetamine (ADDERALL) 15 MG tablet Take 1 tablet by mouth 2 (two) times daily. 60 tablet 0  . busPIRone (BUSPAR) 7.5 MG tablet TAKE 1 TABLET(7.5 MG) BY MOUTH TWICE DAILY 60 tablet 0  . cyclobenzaprine (FLEXERIL) 5 MG tablet Start with one pill by mouth each bedtime as needed due to sedation 30 tablet 1  . escitalopram (LEXAPRO) 20 MG tablet TAKE 1 TABLET(20 MG) BY MOUTH DAILY 30 tablet 0  . estradiol (ESTRACE) 2 MG tablet Take 1 tablet (2 mg total) by mouth daily. 90 tablet 3  . LORazepam (ATIVAN) 0.5 MG tablet TAKE 1 TABLET(0.5 MG) BY MOUTH DAILY AS NEEDED FOR ANXIETY 30 tablet 0  . meloxicam (MOBIC) 15 MG tablet Take 1 tablet (15 mg total) by mouth daily. Beginning AFTER prednisone course is complete 30 tablet 1  . propranolol (INDERAL) 10 MG tablet Take 1 tablet (10 mg total) by mouth 2 (two) times daily. 180 tablet 3   No current facility-administered medications on file prior to visit.    Allergies  Allergen Reactions  . Sulfa Antibiotics    Family History  Problem Relation Age of Onset  . Healthy Mother   . Stroke Paternal Grandmother    Social History    Socioeconomic History  . Marital status: Single    Spouse name: Not on file  . Number of children: Not on file  . Years of education: Not on file  . Highest education level: Not on file  Occupational History  . Not on file  Social Needs  . Financial resource strain: Not on file  . Food insecurity:    Worry: Not on file    Inability: Not on file  . Transportation needs:    Medical: Not on file    Non-medical: Not on file  Tobacco Use  . Smoking status: Never Smoker  . Smokeless tobacco: Never Used  Substance and Sexual Activity  . Alcohol use: No  . Drug use: No  . Sexual activity: Not on file  Lifestyle  . Physical activity:    Days per week: Not on file    Minutes per session: Not on file  . Stress: Not on file  Relationships  . Social connections:    Talks on phone: Not on file    Gets together: Not on file    Attends religious service: Not on file    Active member of club or organization: Not on file    Attends meetings of clubs or organizations: Not on file    Relationship status: Not on file  Other Topics Concern  . Not on file  Social History Narrative  . Not on file   Depression screen [facility] 2/9 11/12/2017 10/20/2017 06/24/2017 03/24/2017  Decreased Interest 0 0 0 2  Down, Depressed, Hopeless 0 0 0 2  PHQ - 2 Score 0 0 0 4  Altered sleeping - - - 2  Tired, decreased energy - - - 0  Change in appetite - - - 3  Feeling bad or failure about yourself  - - - 3  Trouble concentrating - - - 0  Moving slowly or fidgety/restless - - - 0  Suicidal thoughts - - - 0  PHQ-9 Score - - - 12      Review of Systems  Constitutional: Positive for activity change and fatigue. Negative for appetite change, chills, diaphoresis and fever.  Gastrointestinal: Positive for abdominal pain, constipation and nausea.  Musculoskeletal: Positive for arthralgias, back pain, gait problem, myalgias, neck pain and neck stiffness. Negative for joint swelling.  Skin: Negative for color  change, rash and wound.  Allergic/Immunologic: Negative for immunocompromised state.  Neurological: Positive for dizziness, weakness, light-headedness and numbness.  Hematological: Negative for adenopathy. Does not bruise/bleed easily.  Psychiatric/Behavioral: Positive for decreased concentration. Negative for dysphoric mood. The patient is not nervous/anxious.        Objective:   Physical Exam  Constitutional: She is oriented to person, place, and time. She appears well-developed and well-nourished. No distress.  In wheelchair.  HENT:  Head: Normocephalic and atraumatic.  Right Ear: External ear normal.  Left Ear: External ear normal.  Eyes: Conjunctivae are normal. No scleral icterus.  Neck: Normal range of motion. Neck supple. No thyromegaly present.  Cardiovascular: Normal rate, regular rhythm, normal heart sounds and intact distal pulses.  Pulses:      Radial pulses are 2+ on the right side, and 2+ on the left side.  Pulmonary/Chest: Effort normal and breath sounds normal. No respiratory distress.  Musculoskeletal: She exhibits no edema.  Lymphadenopathy:    She has no cervical adenopathy.  Neurological: She is alert and oriented to person, place, and time. No sensory deficit. She exhibits abnormal muscle tone. Gait (in wheelchair - gait unsteady due to lightheadedness and weakness) abnormal.  Reflex Scores:      Tricep reflexes are 2+ on the right side and 2+ on the left side.      Bicep reflexes are 2+ on the right side and 2+ on the left side.      Brachioradialis reflexes are 2+ on the right side and 2+ on the left side. 4+/5 in right upper extremity - delt, bicep, tricep  Skin: Skin is warm and dry. She is not diaphoretic. No erythema.  Psychiatric: She has a normal mood and affect. Her behavior is normal.      BP 106/70 (BP Location: Left Arm, Patient Position: Sitting, Cuff Size: Normal)   Pulse 89   Temp 98.4 F (36.9 C) (Oral)   Resp 17   Ht 5\' 5"  (1.651 m)   Wt  120 lb 9.6 oz (54.7 kg)   SpO2 96%   BMI 20.07 kg/m   Assessment & Plan:   1. Postural orthostatic tachycardia syndrome   2. Chronic fatigue   3. Radicular pain in right arm   4. Right cervical radiculopathy   5. Encounter for long-term (current) use of high-risk medication   6. Disease of spinal cord (HCC)     Orders Placed This Encounter  Procedures  . MR Cervical Spine Wo  Contrast    Standing Status:   Future    Standing Expiration Date:   01/13/2019    Order Specific Question:   What is the patient's sedation requirement?    Answer:   No Sedation    Order Specific Question:   Does the patient have a pacemaker or implanted devices?    Answer:   No    Order Specific Question:   [facility]    [facility]    Order Specific Question:   Radiology Contrast Protocol - do NOT remove file path    Answer:   [email or web address]  . Salivary Cortisol X2, Timed  . Sedimentation Rate  . C-reactive protein    Meds ordered this encounter  Medications  . estradiol (ESTRACE) 2 MG tablet    Sig: Take 1.5 tablets (3 mg total) by mouth daily.    Dispense:  135 tablet    Refill:  0  . HYDROcodone-acetaminophen (NORCO/VICODIN) 5-325 MG tablet    Sig: Take 1-2 tablets by mouth every 4 (four) hours as needed for moderate pain.    Dispense:  30 tablet    Refill:  0  . etodolac (LODINE) 500 MG tablet    Sig: Take 1 tablet (500 mg total) by mouth 2 (two) times daily as needed (for pain, with food).    Dispense:  60 tablet    Refill:  1   Over 40 min spent in face-to-face evaluation of and consultation with patient and coordination of care.  Over 50% of this time was spent counseling this patient regarding possible causes of fatigue and systemic sxs, hypermobile joints, significant joint pain and frequent injury with normal imaging to date (neck xray and shoulder MRI).   Norberto SorensonEva Brynden Thune, M.D.  Primary Care at [facility]   [place] [address] [place], [place] [address] [phone] phone [phone] fax  11/12/17 8:36 PM

## 2017-11-12 NOTE — Progress Notes (Deleted)
By signing my name below, I, Greer Ee, attest that this documentation has been prepared under the directions and in the prescence of Dr. Levell July. Clelia Croft. Electronically Signed: Greer Ee, Medical Scribe 10/20/17 at 5:30 pm.  Subjective:    Patient ID: Maria Bond is a 27 y.o. adult.  Chief Complaint:  Chief Complaint  Patient presents with  . Medication Refill    adderall, pt has a appt on the 22nd, she would like enough til appt. estradiol  . hormone therapy    pt would like hormone checked/ bloodwork  . Follow-up    chrominc conditons, pain in arm shoulder and neck     Maria Bond is a 27 y.o. adult who presents to Primary Care at [facility] here for follow up of pain in arm, shoulder and neck,  medication refill,and  hormone therapy. She reports being on feminizing hormone therapy prescribed by Dr. Ruby Cola since 2013 and not having been checked again. She had bottom surgery on May 2017. Denies any top surgery. Patient has not had prostate removed. She generally takes her doses first thing in the morning. She reports having less energy and some changes after surgery. Patient usually sees Dr. Neva Seat as her PCP. Last time she ate was at 2 pm.   Reports her stomach has been acting weird, an having constipation and cramps. She reports that  takes a lot longer to kick in. She also asked for medication refill for adderall and estrogen. Patient say Dr. Rennis Chris for shoulder and had an MRI which everything seem to be fine. She reports that her knees are hypermobile. She does report weakness in certain positions like having arm elevated. She is right handed. She reports that she has never had a genetic evaluation for hypermobile joints.   Medication Refill  Pertinent negatives include no fever.      Past Medical History:  Diagnosis Date  . ADHD (attention deficit hyperactivity disorder)   . Allergy   . Anemia   . Anxiety   . Autistic disorder   . Depression   . Tachycardia      Past Surgical History:  Procedure Laterality Date  . VAGINOPLASTY  08/26/2015    Family History  Problem Relation Age of Onset  . Healthy Mother   . Stroke Paternal Grandmother     Social History   Tobacco Use  . Smoking status: Never Smoker  . Smokeless tobacco: Never Used  Substance Use Topics  . Alcohol use: No  . Drug use: No    Review of Systems  Constitutional: Negative for fever and unexpected weight change.       She reports having lower energy levels since surgery.  HENT: Negative.   Eyes: Negative.   Respiratory: Negative.   Cardiovascular: Negative.   Gastrointestinal: Negative.   Endocrine: Negative.   Skin: Negative.   Allergic/Immunologic: Negative.   Neurological:       She gets lightheaded when she walks over 80 feet.  Hematological: Negative for adenopathy.  Psychiatric/Behavioral: Negative.     Objective:   BP 124/77   Pulse 85   Temp 98.7 F (37.1 C) (Oral)   Resp 16   Ht 5\' 5"  (1.651 m)   SpO2 98%   BMI 19.97 kg/m   Physical Exam  Constitutional: She is oriented to person, place, and time. She appears well-developed and well-nourished.  Neck:    Extension is limited. Full lateral flexion to left and moderately restricted to the right.  No paraspinal  masses, neck is supple.  Musculoskeletal:       Right elbow: She exhibits normal range of motion.       Arms: Patient has been on a wheelchair for about 2 years. Reports walking reliably 80 feet but feeling lightheaded and wobbly.  2+ on bicep and tricep on the right. 1+ bicep an brachalisin the left 2+ tricep. 4+ out of 5+ on the left and 5 out 5 on the right digits. 5 out of 5 wrist flexion and extension, 4 out 5 on the right bicep 5 out of 5 in the left bicep.  4+ out of 5 on the right deltoid.  Neurological: She is alert and oriented to person, place, and time.   Dg Cervical Spine Complete  Result Date: 10/20/2017 CLINICAL DATA:  Right cervical radiculopathy EXAM: CERVICAL  SPINE - COMPLETE 4+ VIEW COMPARISON:  None. FINDINGS: C1 to the superior endplate of T2 is imaged on the provided lateral radiograph. Normal alignment of the cervical spine. Bilateral facets appear normally aligned. The dens is normally positioned between the lateral masses of C1. Cervical vertebral body heights appear preserved. Prevertebral soft tissues appear normal. Cervical intervertebral disc space heights appear preserved. The bilateral neural foramina appear widely patent. Regional soft tissues appear normal. Limited visualization of the lung apices is normal. IMPRESSION: Normal radiographs of the cervical spine. Electronically Signed   By: Simonne Come M.D.   On: 10/20/2017 18:10    Assessment:   Procedures  The primary encounter diagnosis was Chronic fatigue. Diagnoses of Attention deficit hyperactivity disorder (ADHD), unspecified ADHD type, Depression with anxiety, Trapezius muscle spasm, Right cervical radiculopathy, and Hypermobile joints were also pertinent to this visit.  Plan:   1. Chronic fatigue   2. Attention deficit hyperactivity disorder (ADHD), unspecified ADHD type   3. Depression with anxiety   4. Trapezius muscle spasm   5. Right cervical radiculopathy   6. Hypermobile joints     Orders Placed This Encounter  Procedures  . DG Cervical Spine Complete    Standing Status:   Future    Number of Occurrences:   1    Standing Expiration Date:   10/20/2018    Order Specific Question:   Reason for Exam (SYMPTOM  OR DIAGNOSIS REQUIRED)    Answer:   right arm weakness and pain    Order Specific Question:   Is the patient pregnant?    Answer:   No    Order Specific Question:   Preferred imaging location?    Answer:   External  . Estradiol  . Comprehensive metabolic panel  . CBC with Differential/Platelet  . TSH  . TestT+TestF+SHBG  . Ambulatory referral to Genetics    Referral Priority:   Routine    Referral Type:   Consultation    Referral Reason:   Specialty  Services Required    Number of Visits Requested:   1  . Ambulatory referral to Physical Therapy    Referral Priority:   Routine    Referral Type:   Physical Medicine    Referral Reason:   Specialty Services Required    Requested Specialty:   Physical Therapy    Number of Visits Requested:   1    Meds ordered this encounter  Medications  . amphetamine-dextroamphetamine (ADDERALL) 15 MG tablet    Sig: Take 1 tablet by mouth 2 (two) times daily.    Dispense:  60 tablet    Refill:  0  . estradiol (ESTRACE) 2 MG tablet  Sig: Take 1 tablet (2 mg total) by mouth daily.    Dispense:  90 tablet    Refill:  3  . busPIRone (BUSPAR) 7.5 MG tablet    Sig: TAKE 1 TABLET(7.5 MG) BY MOUTH TWICE DAILY    Dispense:  60 tablet    Refill:  0  . cyclobenzaprine (FLEXERIL) 5 MG tablet    Sig: Start with one pill by mouth each bedtime as needed due to sedation    Dispense:  30 tablet    Refill:  1  . predniSONE (DELTASONE) 20 MG tablet    Sig: Take 3 tabs qd x 3d, then 2 tabs qd x 3d then 1 tab qd x 3d.    Dispense:  18 tablet    Refill:  0  . meloxicam (MOBIC) 15 MG tablet    Sig: Take 1 tablet (15 mg total) by mouth daily. Beginning AFTER prednisone course is complete    Dispense:  30 tablet    Refill:  1  . LORazepam (ATIVAN) 0.5 MG tablet    Sig: TAKE 1 TABLET(0.5 MG) BY MOUTH DAILY AS NEEDED FOR ANXIETY    Dispense:  30 tablet    Refill:  0    I personally performed the services described in this documentation, which was scribed in my presence. The recorded information has been reviewed and considered, and addended by me as needed.   Norberto SorensonEva Diante Barley, M.D.  Primary Care at [facility]   [address] [place], [place] [address] [phone] phone [phone] fax  11/12/17 6:00 PM

## 2017-11-13 LAB — C-REACTIVE PROTEIN: CRP: <1 mg/L (ref 0–10)

## 2017-11-13 LAB — SEDIMENTATION RATE: Sed Rate: 2 mm/hr (ref 0–32)

## 2017-11-23 LAB — SALIVARY CORTISOL X2, TIMED
Salivary Cortisol 2nd Specimen: 0.218 ug/dL
Salivary Cortisol Baseline: <0.01 ug/dL

## 2017-12-01 ENCOUNTER — Ambulatory Visit: Payer: PRIVATE HEALTH INSURANCE | Admitting: Family Medicine

## 2017-12-05 ENCOUNTER — Encounter: Payer: Self-pay | Admitting: Emergency Medicine

## 2017-12-05 ENCOUNTER — Ambulatory Visit (INDEPENDENT_AMBULATORY_CARE_PROVIDER_SITE_OTHER): Payer: PRIVATE HEALTH INSURANCE | Admitting: Emergency Medicine

## 2017-12-05 VITALS — BP 123/82 | HR 107 | Temp 98.4°F | Resp 16

## 2017-12-05 DIAGNOSIS — Z23 Encounter for immunization: Secondary | ICD-10-CM | POA: Diagnosis not present

## 2017-12-05 DIAGNOSIS — M5412 Radiculopathy, cervical region: Secondary | ICD-10-CM | POA: Insufficient documentation

## 2017-12-05 DIAGNOSIS — M792 Neuralgia and neuritis, unspecified: Secondary | ICD-10-CM | POA: Diagnosis not present

## 2017-12-05 MED ORDER — HYDROCODONE-ACETAMINOPHEN 5-325 MG PO TABS: 1.0000 | ORAL_TABLET | ORAL | 0 refills | Status: DC | PRN

## 2017-12-05 NOTE — Progress Notes (Signed)
Maria Bond 27 y.o.   Chief Complaint  Patient presents with  . Medication Refill    Hyrocodone  . Flu Vaccine    HISTORY OF PRESENT ILLNESS: This is a 27 y.o. adult with history of right cervical radiculopathy and radicular pain to the right arm.  Has been getting physical therapy.  States it has been helping.  Was started on hydrocodone during last visit with Dr. Clelia CroftShaw on 11/12/2017.  Physical therapy twice a week.  Takes hydrocodone only after therapy.  Need for medication has decreased.  Here today for medication refill.  Unable to see Dr. Clelia CroftShaw today.  HPI   Prior to Admission medications   Medication Sig Start Date End Date Taking? Authorizing Provider  amphetamine-dextroamphetamine (ADDERALL) 15 MG tablet Take 1 tablet by mouth 2 (two) times daily. 10/20/17  Yes Sherren MochaShaw, Eva N, MD  busPIRone (BUSPAR) 7.5 MG tablet TAKE 1 TABLET(7.5 MG) BY MOUTH TWICE DAILY 10/20/17  Yes Sherren MochaShaw, Eva N, MD  cyclobenzaprine (FLEXERIL) 5 MG tablet Start with one pill by mouth each bedtime as needed due to sedation 10/20/17  Yes Sherren MochaShaw, Eva N, MD  escitalopram (LEXAPRO) 20 MG tablet TAKE 1 TABLET(20 MG) BY MOUTH DAILY 10/14/17  Yes Shade FloodGreene, Jeffrey R, MD  estradiol (ESTRACE) 2 MG tablet Take 1.5 tablets (3 mg total) by mouth daily. 11/12/17  Yes Sherren MochaShaw, Eva N, MD  etodolac (LODINE) 500 MG tablet Take 1 tablet (500 mg total) by mouth 2 (two) times daily as needed (for pain, with food). 11/12/17  Yes Sherren MochaShaw, Eva N, MD  HYDROcodone-acetaminophen (NORCO/VICODIN) 5-325 MG tablet Take 1-2 tablets by mouth every 4 (four) hours as needed for moderate pain. 11/12/17  Yes Sherren MochaShaw, Eva N, MD  LORazepam (ATIVAN) 0.5 MG tablet TAKE 1 TABLET(0.5 MG) BY MOUTH DAILY AS NEEDED FOR ANXIETY 10/20/17  Yes Sherren MochaShaw, Eva N, MD  propranolol (INDERAL) 10 MG tablet Take 1 tablet (10 mg total) by mouth 2 (two) times daily. 09/12/17  Yes Duke SalviaKlein, Steven C, MD    Allergies  Allergen Reactions  . Sulfa Antibiotics     Patient Active Problem List   Diagnosis  Date Noted  . Depression 04/27/2014  . Asperger's disorder 12/06/2011  . Anxiety 12/06/2011    Past Medical History:  Diagnosis Date  . ADHD (attention deficit hyperactivity disorder)   . Allergy   . Anemia   . Anxiety   . Autistic disorder   . Depression   . Tachycardia     Past Surgical History:  Procedure Laterality Date  . VAGINOPLASTY  08/26/2015    Social History   Socioeconomic History  . Marital status: Single    Spouse name: Not on file  . Number of children: Not on file  . Years of education: Not on file  . Highest education level: Not on file  Occupational History  . Not on file  Social Needs  . Financial resource strain: Not on file  . Food insecurity:    Worry: Not on file    Inability: Not on file  . Transportation needs:    Medical: Not on file    Non-medical: Not on file  Tobacco Use  . Smoking status: Never Smoker  . Smokeless tobacco: Never Used  Substance and Sexual Activity  . Alcohol use: No  . Drug use: No  . Sexual activity: Not on file  Lifestyle  . Physical activity:    Days per week: Not on file    Minutes per session: Not on  file  . Stress: Not on file  Relationships  . Social connections:    Talks on phone: Not on file    Gets together: Not on file    Attends religious service: Not on file    Active member of club or organization: Not on file    Attends meetings of clubs or organizations: Not on file    Relationship status: Not on file  . Intimate partner violence:    Fear of current or ex partner: Not on file    Emotionally abused: Not on file    Physically abused: Not on file    Forced sexual activity: Not on file  Other Topics Concern  . Not on file  Social History Narrative  . Not on file    Family History  Problem Relation Age of Onset  . Healthy Mother   . Stroke Paternal Grandmother      Review of Systems  Constitutional: Negative.  Negative for chills and fever.  HENT: Negative for sore throat.     Respiratory: Negative for cough and shortness of breath.   Cardiovascular: Negative.  Negative for chest pain and palpitations.  Gastrointestinal: Negative for nausea and vomiting.  Musculoskeletal: Positive for neck pain.       Right arm pain  Skin: Negative.  Negative for rash.  Neurological: Negative for dizziness and headaches.  All other systems reviewed and are negative.   Vitals:   12/05/17 1012  BP: 123/82  Pulse: (!) 107  Resp: 16  Temp: 98.4 F (36.9 C)  SpO2: 97%    Physical Exam  Constitutional: She is oriented to person, place, and time. She appears well-developed and well-nourished.  In a wheelchair  HENT:  Head: Normocephalic and atraumatic.  Eyes: Pupils are equal, round, and reactive to light. EOM are normal.  Neck: Normal range of motion. Neck supple.  Cardiovascular: Normal rate and regular rhythm.  Pulmonary/Chest: Effort normal.  Musculoskeletal:  Right arm: FROM and non-tender  Neurological: She is alert and oriented to person, place, and time.  Skin: Skin is warm and dry. Capillary refill takes less than 2 seconds.  Psychiatric: She has a normal mood and affect. Her behavior is normal.  Vitals reviewed.    ASSESSMENT & PLAN:  Maria Bond was seen today for medication refill and flu vaccine.  Diagnoses and all orders for this visit:  Radicular pain in right arm -     HYDROcodone-acetaminophen (NORCO/VICODIN) 5-325 MG tablet; Take 1-2 tablets by mouth every 4 (four) hours as needed for moderate pain.  Right cervical radiculopathy  Need for prophylactic vaccination and inoculation against influenza -     Flu Vaccine QUAD 6+ mos PF IM (Fluarix Quad PF)    Patient Instructions       If you have lab work done today you will be contacted with your lab results within the next 2 weeks.  If you have not heard from [place] then please contact us. The fastest way to get your results is to register for My Chart.   IF you received an x-ray today, you  will receive an invoice from [facility]. Please contact [facility] at [phone] with questions or concerns regarding your invoice.   IF you received labwork today, you will receive an invoice from [facility]. Please contact [facility] at [phone] with questions or concerns regarding your invoice.   Our billing staff will not be able to assist you with questions regarding bills from these companies.  You will be contacted  with the lab results as soon as they are available. The fastest way to get your results is to activate your My Chart account. Instructions are located on the last page of this paperwork. If you have not heard from [place] regarding the results in 2 weeks, please contact this office.      Cervical Radiculopathy Cervical radiculopathy means that a nerve in the neck is pinched or bruised. This can cause pain or loss of feeling (numbness) that runs from your neck to your arm and fingers. Follow these instructions at home: Managing pain  Take over-the-counter and prescription medicines only as told by your doctor.  If directed, put ice on the injured or painful area. ? Put ice in a plastic bag. ? Place a towel between your skin and the bag. ? Leave the ice on for 20 minutes, 2-3 times per day.  If ice does not help, you can try using heat. Take a warm shower or warm bath, or use a heat pack as told by your doctor.  You may try a gentle neck and shoulder massage. Activity  Rest as needed. Follow instructions from your doctor about any activities to avoid.  Do exercises as told by your doctor or physical therapist. General instructions  If you were given a soft collar, wear it as told by your doctor.  Use a flat pillow when you sleep.  Keep all follow-up visits as told by your doctor. This is important. Contact a doctor if:  Your condition does not improve with treatment. Get help right away if:  Your pain gets worse and is not controlled with  medicine.  You lose feeling or feel weak in your hand, arm, face, or leg.  You have a fever.  You have a stiff neck.  You cannot control when you poop or pee (have incontinence).  You have trouble with walking, balance, or talking. This information is not intended to replace advice given to you by your health care provider. Make sure you discuss any questions you have with your health care provider. Document Released: 03/14/2011 Document Revised: 08/31/2015 Document Reviewed: 05/19/2014 Elsevier Interactive Patient Education  2018 [employer].     Edwina Barth, MD [facility]

## 2017-12-05 NOTE — Patient Instructions (Addendum)
     If you have lab work done today you will be contacted with your lab results within the next 2 weeks.  If you have not heard from us then please contact us. The fastest way to get your results is to register for My Chart.   IF you received an x-ray today, you will receive an invoice from [facility]. Please contact [facility] at [phone] with questions or concerns regarding your invoice.   IF you received labwork today, you will receive an invoice from [facility]. Please contact [facility] at [phone] with questions or concerns regarding your invoice.   Our billing staff will not be able to assist you with questions regarding bills from these companies.  You will be contacted with the lab results as soon as they are available. The fastest way to get your results is to activate your My Chart account. Instructions are located on the last page of this paperwork. If you have not heard from us regarding the results in 2 weeks, please contact this office.     Cervical Radiculopathy Cervical radiculopathy means that a nerve in the neck is pinched or bruised. This can cause pain or loss of feeling (numbness) that runs from your neck to your arm and fingers. Follow these instructions at home: Managing pain  Take over-the-counter and prescription medicines only as told by your doctor.  If directed, put ice on the injured or painful area. ? Put ice in a plastic bag. ? Place a towel between your skin and the bag. ? Leave the ice on for 20 minutes, 2-3 times per day.  If ice does not help, you can try using heat. Take a warm shower or warm bath, or use a heat pack as told by your doctor.  You may try a gentle neck and shoulder massage. Activity  Rest as needed. Follow instructions from your doctor about any activities to avoid.  Do exercises as told by your doctor or physical therapist. General instructions  If you were given a soft collar, wear it as told by your  doctor.  Use a flat pillow when you sleep.  Keep all follow-up visits as told by your doctor. This is important. Contact a doctor if:  Your condition does not improve with treatment. Get help right away if:  Your pain gets worse and is not controlled with medicine.  You lose feeling or feel weak in your hand, arm, face, or leg.  You have a fever.  You have a stiff neck.  You cannot control when you poop or pee (have incontinence).  You have trouble with walking, balance, or talking. This information is not intended to replace advice given to you by your health care provider. Make sure you discuss any questions you have with your health care provider. Document Released: 03/14/2011 Document Revised: 08/31/2015 Document Reviewed: 05/19/2014 Elsevier Interactive Patient Education  2018 Elsevier Inc.   

## 2017-12-10 ENCOUNTER — Encounter: Payer: Self-pay | Admitting: Internal Medicine

## 2017-12-25 ENCOUNTER — Ambulatory Visit: Payer: PRIVATE HEALTH INSURANCE | Admitting: Internal Medicine

## 2018-01-12 ENCOUNTER — Encounter: Payer: Self-pay | Admitting: Family Medicine

## 2018-01-12 DIAGNOSIS — F640 Transsexualism: Secondary | ICD-10-CM | POA: Insufficient documentation

## 2018-01-12 DIAGNOSIS — Z789 Other specified health status: Secondary | ICD-10-CM | POA: Insufficient documentation

## 2018-01-12 NOTE — Progress Notes (Signed)
By signing my name below, I, Greer Ee, attest that this documentation has been prepared under the directions and in the prescence of Dr. Levell July. Clelia Croft. Electronically Signed: Greer Ee, Medical Scribe 10/20/17 at 5:30 pm.  Subjective:    Patient ID: Maria Bond is a 27 y.o. adult.  Chief Complaint:  Chief Complaint  Patient presents with  . Medication Refill    adderall, pt has a appt on the 22nd, she would like enough til appt. estradiol  . hormone therapy    pt would like hormone checked/ bloodwork  . Follow-up    chrominc conditons, pain in arm shoulder and neck     Maria Bond is a 27 y.o. adult who presents to Primary Care at [facility] here for follow up of pain in arm, shoulder and neck,  medication refill,and establish care to continue on gender affirming hormone therapy.   Gender-Affirming Hormone Therapy She reports being on feminizing hormone therapy prescribed by Dr. Ruby Cola since 2013 and not having been checked again. She had bottom surgery on Aug 26, 2015. Denies any top surgery. Patient has not had prostate removed. She generally takes her dose of estradiol 2mg  first thing in the morning. She reports having less energy and some changes after surgery. Patient usually sees Dr. Neva Seat, my colleague here at [facility], as her PCP but as Dr. Ruby Cola retired she will transfer her care to me since I also manage [facility].  She also asked for medication refill for estrogen 2mg  qd- prefers 90d at a time.  Not fasting today.   ADD/Anxiety Reports her stomach has been acting weird, and having constipation and cramps.  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 She reports that the adderall IR 15 bid takes a lot longer to kick in. She also asked for medication refill for adderall, lorazepam 0.5mg  qd prn, and buspar 7.5 bid - she has an appointment to establish with a psychiatrist [facility] Jannifer [facility] on 10/27/17 but she would like refills on her med to get her to that appointment. Also on lexapro 20 and propranolol 10 bid  Right shoulder pain Patient saw ortho Dr. Rennis Chris for this problem and had a MRI of her Rt shoulder - was told that everything seem to be fine and there wasn't a source of her pain found, pt denies that any further next steps were discussed offered to continue evaluating her Right shoulder pain that radiates down her arm and up her neck. She does report weakness in certain positions like having arm elevated. She is right handed  Taking flexeril 5mg  q8hrs prn.  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  She reports that she has never had a genetic evaluation for hypermobile joints.   Medication Refill  Pertinent negatives include no fever.      Past Medical History:  Diagnosis Date  . ADHD (attention deficit hyperactivity disorder)   . Allergy   . Anemia   . Anxiety   . Autistic disorder   . Depression   . Tachycardia     Past Surgical History:  Procedure Laterality Date  . VAGINOPLASTY  08/26/2015    Family History  Problem Relation Age of Onset  . Healthy Mother   . Arthritis Father 23       degenerative disc disease  . Stroke Paternal Grandmother     Social History   Tobacco  Use  . Smoking status: Never Smoker  . Smokeless tobacco: Never Used  Substance Use Topics  . Alcohol use: No  . Drug use: No    Review of Systems  Constitutional: Negative for fever and unexpected weight change.       She reports having lower energy levels since surgery.  HENT: Negative.   Eyes: Negative.   Respiratory: Negative.   Cardiovascular: Negative.   Gastrointestinal: Negative.   Endocrine: Negative.   Skin: Negative.   Allergic/Immunologic: Negative.   Neurological:       She gets lightheaded when she walks over 80 feet.  Hematological: Negative for adenopathy.  Psychiatric/Behavioral: Negative.     Objective:   BP  124/77   Pulse 85   Temp 98.7 F (37.1 C) (Oral)   Resp 16   Ht 5\' 5"  (1.651 m)   SpO2 98%   BMI 19.97 kg/m   Physical Exam  Constitutional: She is oriented to person, place, and time. She appears well-developed and well-nourished.  Neck:    Extension is limited. Full lateral flexion to left and moderately restricted to the right.  No paraspinal masses, neck is supple.  Musculoskeletal:       Right elbow: She exhibits normal range of motion.       Arms: Patient has been on a wheelchair for about 2 years. Reports walking reliably 80 feet but feeling lightheaded and wobbly.  2+ on bicep and tricep on the right. 1+ bicep an brachalisin the left 2+ tricep. 4+ out of 5+ on the left and 5 out 5 on the right digits. 5 out of 5 wrist flexion and extension, 4 out 5 on the right bicep 5 out of 5 in the left bicep.  4+ out of 5 on the right deltoid.  Neurological: She is alert and oriented to person, place, and time.   Dg Cervical Spine Complete  Result Date: 10/20/2017 CLINICAL DATA:  Right cervical radiculopathy EXAM: CERVICAL SPINE - COMPLETE 4+ VIEW COMPARISON:  None. FINDINGS: C1 to the superior endplate of T2 is imaged on the provided lateral radiograph. Normal alignment of the cervical spine. Bilateral facets appear normally aligned. The dens is normally positioned between the lateral masses of C1. Cervical vertebral body heights appear preserved. Prevertebral soft tissues appear normal. Cervical intervertebral disc space heights appear preserved. The bilateral neural foramina appear widely patent. Regional soft tissues appear normal. Limited visualization of the lung apices is normal. IMPRESSION: Normal radiographs of the cervical spine. Electronically Signed   By: Simonne Come M.D.   On: 10/20/2017 18:10    Assessment:   1. Chronic fatigue   2. Attention deficit hyperactivity disorder (ADHD), unspecified ADHD type   3. Depression with anxiety - going to establish with Dr. Jannifer Franklin on  10/27/17 - provided refills to get to that appt  4. Trapezius muscle spasm   5. Right cervical radiculopathy - start trial of prednisone - pay attention to how it works for sx relief - if exacerbates mood sxs, ok to d/c immed. When pred complete, then start meloxicam qd. refer to PT for trx - needs PT in the [facility] per her insurance. F/u in 6 wks - if not resolved or sig improved, will likely want to proceed with c-spine MRI at that time  6. Hypermobile joints - refer to genetics clinic at [facility], [facility], or [facility] [place] for evaluation for EDS  7.      Female-to-female transgender person - cont estradiol 2mg  qam.  8.      Encounter for long-term current use of high risk medication  Plan:    Orders Placed This Encounter  Procedures  . DG Cervical Spine Complete    Standing Status:   Future    Number of Occurrences:   1    Standing Expiration Date:   10/20/2018    Order Specific Question:   Reason for Exam (SYMPTOM  OR DIAGNOSIS REQUIRED)    Answer:   right arm weakness and pain    Order Specific Question:   Is the patient pregnant?    Answer:   No    Order Specific Question:   Preferred imaging location?    Answer:   External  . Estradiol  . Comprehensive metabolic panel  . CBC with Differential/Platelet  . TSH  . TestT+TestF+SHBG  . Ambulatory referral to Genetics    Referral Priority:   Routine    Referral Type:   Consultation    Referral Reason:   Specialty Services Required    Number of Visits Requested:   1  . Ambulatory referral to Physical Therapy    Referral Priority:   Routine    Referral Type:   Physical Medicine    Referral Reason:   Specialty Services Required    Requested Specialty:   Physical Therapy    Number of Visits Requested:   1    Meds ordered this encounter  Medications  . amphetamine-dextroamphetamine (ADDERALL) 15 MG tablet    Sig: Take 1 tablet by mouth 2 (two) times daily.    Dispense:  60 tablet    Refill:  0  .  estradiol (ESTRACE) 2 MG tablet    Sig: Take 1 tablet (2 mg total) by mouth daily.    Dispense:  90 tablet    Refill:  3  . busPIRone (BUSPAR) 7.5 MG tablet    Sig: TAKE 1 TABLET(7.5 MG) BY MOUTH TWICE DAILY    Dispense:  60 tablet    Refill:  0  . cyclobenzaprine (FLEXERIL) 5 MG tablet    Sig: Start with one pill by mouth each bedtime as needed due to sedation    Dispense:  30 tablet    Refill:  1  . predniSONE (DELTASONE) 20 MG tablet    Sig: Take 3 tabs qd x 3d, then 2 tabs qd x 3d then 1 tab qd x 3d.    Dispense:  18 tablet    Refill:  0  . meloxicam (MOBIC) 15 MG tablet    Sig: Take 1 tablet (15 mg total) by mouth daily. Beginning AFTER prednisone course is complete    Dispense:  30 tablet    Refill:  1  . LORazepam (ATIVAN) 0.5 MG tablet    Sig: TAKE 1 TABLET(0.5 MG) BY MOUTH DAILY AS NEEDED FOR ANXIETY    Dispense:  30 tablet    Refill:  0    I personally performed the services described in this documentation, which was scribed in my presence. The recorded information has been reviewed and considered, and addended by me as needed.   Norberto Sorenson, M.D.  Primary Care at [facility] [address] [place], [place] [address] [phone] phone [phone] fax  01/12/18 2:46 AM

## 2018-02-15 ENCOUNTER — Emergency Department (HOSPITAL_COMMUNITY)
Admission: EM | Admit: 2018-02-15 | Discharge: 2018-02-15 | Disposition: A | Discharge status: Home / Self Care | Payer: PRIVATE HEALTH INSURANCE | Attending: Emergency Medicine | Admitting: Emergency Medicine

## 2018-02-15 ENCOUNTER — Other Ambulatory Visit: Payer: Self-pay

## 2018-02-15 ENCOUNTER — Encounter (HOSPITAL_COMMUNITY): Payer: Self-pay

## 2018-02-15 ENCOUNTER — Ambulatory Visit (HOSPITAL_COMMUNITY)
Admission: EM | Admit: 2018-02-15 | Discharge: 2018-02-15 | Disposition: A | Discharge status: Home / Self Care | Payer: PRIVATE HEALTH INSURANCE | Source: Home / Self Care

## 2018-02-15 DIAGNOSIS — F909 Attention-deficit hyperactivity disorder, unspecified type: Secondary | ICD-10-CM | POA: Diagnosis not present

## 2018-02-15 DIAGNOSIS — H532 Diplopia: Secondary | ICD-10-CM | POA: Diagnosis present

## 2018-02-15 DIAGNOSIS — Z79899 Other long term (current) drug therapy: Secondary | ICD-10-CM | POA: Diagnosis not present

## 2018-02-15 HISTORY — DX: Tachycardia, unspecified: R00.0

## 2018-02-15 HISTORY — DX: Other specified cardiac arrhythmias: I49.8

## 2018-02-15 HISTORY — DX: Postural orthostatic tachycardia syndrome (POTS): G90.A

## 2018-02-15 HISTORY — DX: Orthostatic hypotension: I95.1

## 2018-02-15 NOTE — ED Provider Notes (Signed)
[facility] EMERGENCY DEPARTMENT Provider Note   CSN: [id number] Arrival date & time: 02/15/18  1028     History   Chief Complaint Chief Complaint  Patient presents with  . Diplopia    left eye since Friday    HPI Maria Bond is a 27 y.o. adult.  Patient is a 27 year old female who presents with double vision.  She states is been going on for 2 days.  She states she noticed about 6:00 PM on Friday when she was driving.  She notes that it is only in her left eye.  When she covers her right eye she still notes that she is seeing 2 of things.  She denies any blurry vision.  No pain in the eye.  No drainage.  No redness.  No headache.  No numbness to her face.  No dizziness or ataxia.  No numbness or weakness to her extremities.  No recent injuries.  She does say that she was running some metal yesterday but the symptoms started before that and she does not feel that she got anything in her eye.  She does wear glasses but does not have an ophthalmologist.     Past Medical History:  Diagnosis Date  . ADHD (attention deficit hyperactivity disorder)   . Allergy   . Anemia   . Anxiety   . Autistic disorder   . Depression   . POTS (postural orthostatic tachycardia syndrome)   . Tachycardia     Patient Active Problem List   Diagnosis Date Noted  . Female-to-female transgender person 01/12/2018  . Radicular pain in right arm 12/05/2017  . Right cervical radiculopathy 12/05/2017  . Need for prophylactic vaccination and inoculation against influenza 12/05/2017  . Depression 04/27/2014  . Asperger's disorder 12/06/2011  . Anxiety 12/06/2011    Past Surgical History:  Procedure Laterality Date  . VAGINOPLASTY  08/26/2015     OB History   None      Home Medications    Prior to Admission medications   Medication Sig Start Date End Date Taking? Authorizing Provider  amphetamine-dextroamphetamine (ADDERALL) 15 MG tablet Take 1 tablet by mouth 2 (two)  times daily. 10/20/17  Yes Sherren Mocha, MD  busPIRone (BUSPAR) 7.5 MG tablet TAKE 1 TABLET(7.5 MG) BY MOUTH TWICE DAILY Patient taking differently: Take 10 mg by mouth 2 (two) times daily.  10/20/17  Yes Sherren Mocha, MD  escitalopram (LEXAPRO) 20 MG tablet TAKE 1 TABLET(20 MG) BY MOUTH DAILY Patient taking differently: Take 20 mg by mouth daily.  10/14/17  Yes Shade Flood, MD  estradiol (ESTRACE) 2 MG tablet Take 1.5 tablets (3 mg total) by mouth daily. 11/12/17  Yes Sherren Mocha, MD  hydrOXYzine (ATARAX/VISTARIL) 25 MG tablet Take 25 mg by mouth 2 (two) times daily.  11/23/17  Yes [provider]  LORazepam (ATIVAN) 0.5 MG tablet TAKE 1 TABLET(0.5 MG) BY MOUTH DAILY AS NEEDED FOR ANXIETY 10/20/17  Yes Sherren Mocha, MD  Multiple Vitamin (MULTIVITAMIN WITH MINERALS) TABS tablet Take 1 tablet by mouth daily.   Yes [provider]  propranolol (INDERAL) 10 MG tablet Take 1 tablet (10 mg total) by mouth 2 (two) times daily. 09/12/17  Yes Duke Salvia, MD  cyclobenzaprine (FLEXERIL) 5 MG tablet Start with one pill by mouth each bedtime as needed due to sedation Patient not taking: Reported on 02/15/2018 10/20/17   Sherren Mocha, MD  etodolac (LODINE) 500 MG tablet Take 1 tablet (  500 mg total) by mouth 2 (two) times daily as needed (for pain, with food). Patient not taking: Reported on 02/15/2018 11/12/17   Sherren Mocha, MD  HYDROcodone-acetaminophen (NORCO/VICODIN) 5-325 MG tablet Take 1-2 tablets by mouth every 4 (four) hours as needed for moderate pain. Patient not taking: Reported on 02/15/2018 12/05/17   Georgina Quint, MD    Family History Family History  Problem Relation Age of Onset  . Healthy Mother   . Arthritis Father 70       degenerative disc disease  . Stroke Paternal Grandmother     Social History Social History   Tobacco Use  . Smoking status: Never Smoker  . Smokeless tobacco: Never Used  Substance Use Topics  . Alcohol use: No  . Drug use: No      Allergies   Sulfa antibiotics   Review of Systems Review of Systems  Constitutional: Negative for chills, diaphoresis, fatigue and fever.  HENT: Negative for congestion, rhinorrhea and sneezing.   Eyes: Positive for visual disturbance. Negative for photophobia, pain, discharge and redness.  Respiratory: Negative for cough, chest tightness and shortness of breath.   Cardiovascular: Negative for chest pain and leg swelling.  Gastrointestinal: Negative for abdominal pain, blood in stool, diarrhea, nausea and vomiting.  Genitourinary: Negative for difficulty urinating, flank pain, frequency and hematuria.  Musculoskeletal: Negative for arthralgias and back pain.  Skin: Negative for rash.  Neurological: Negative for dizziness, speech difficulty, weakness, numbness and headaches.     Physical Exam Updated Vital Signs BP 109/81   Pulse 79   Temp 98.5 F (36.9 C) (Oral)   Resp 16   Ht 5\' 5"  (1.651 m)   Wt 54.4 kg   SpO2 100%   BMI 19.97 kg/m   Physical Exam  Constitutional: She is oriented to person, place, and time. She appears well-developed and well-nourished.  HENT:  Head: Normocephalic and atraumatic.  Eyes: Pupils are equal, round, and reactive to light.  Patient has a normal-appearing left eye.  There is no erythema.  No drainage.  No tenderness on eye movements or palpation of the eye.  No proptosis.  No swelling around the eye.  She has normal extraocular eye movements.  Normal light reflexes.  Her visual acuity is 20/25 in both eyes.  Neck: Normal range of motion. Neck supple.  Cardiovascular: Normal rate, regular rhythm and normal heart sounds.  Pulmonary/Chest: Effort normal and breath sounds normal. No respiratory distress. She has no wheezes. She has no rales. She exhibits no tenderness.  Abdominal: Soft. Bowel sounds are normal. There is no tenderness. There is no rebound and no guarding.  Musculoskeletal: Normal range of motion. She exhibits no edema.   Lymphadenopathy:    She has no cervical adenopathy.  Neurological: She is alert and oriented to person, place, and time.  Skin: Skin is warm and dry. No rash noted.  Psychiatric: She has a normal mood and affect.     ED Treatments / Results  Labs (all labs ordered are listed, but only abnormal results are displayed) Labs Reviewed - No data to display  EKG None  Radiology No results found.  Procedures Procedures (including critical care time)  Medications Ordered in ED Medications - No data to display   Initial Impression / Assessment and Plan / ED Course  I have reviewed the triage vital signs and the nursing notes.  Pertinent labs & imaging results that were available during my care of the patient were reviewed by me  and considered in my medical decision making (see chart for details).     Patient has monocular double vision.  It is not changed when the right eye is covered.  She does not have other neurologic deficits.  I spoke with Dr. Charlotte Sanes with ophthalmology who feels that this could represent possibly an uncorrected astigmatism versus cataract.  GIven that it doesn't change with covering the right eye, it does not seem to be neurologic in nature.  She recommends that patient be discharged with ophthalmology outpatient follow-up.  Dr. Marnee Spring office will be able to see the patient and I gave the patient the contact information to call tomorrow morning after 8:00 for an appointment.  Return precautions were given.  Final Clinical Impressions(s) / ED Diagnoses   Final diagnoses:  Diplopia    ED Discharge Orders    None       Rolan Bucco, MD 02/15/18 1232

## 2018-02-15 NOTE — ED Notes (Signed)
Per pt, started with sudden onset double vision 2 days ago.  Per Dr Delton See, recommend pt goes to ED due to neurologic possibility.  Pt and friend/family member verbalized understanding.

## 2018-02-15 NOTE — ED Triage Notes (Signed)
Pt reports double vision in her left eye that started Friday evening. Pt states that she sees "two of everything in left eye." Pt reports that it is worse in the dark. Pt reports a headache yesterday that resolved with NSAIDS and that her left eye is sensitive to light. Pt denies any dizziness, or difficulty ambulating.

## 2018-04-02 ENCOUNTER — Ambulatory Visit: Payer: PRIVATE HEALTH INSURANCE | Admitting: Family Medicine

## 2018-04-08 HISTORY — PX: CHOLECYSTECTOMY: SHX55

## 2018-04-20 DIAGNOSIS — Z0271 Encounter for disability determination: Secondary | ICD-10-CM

## 2018-06-04 ENCOUNTER — Other Ambulatory Visit: Payer: Self-pay | Admitting: Internal Medicine

## 2018-11-05 ENCOUNTER — Ambulatory Visit (INDEPENDENT_AMBULATORY_CARE_PROVIDER_SITE_OTHER): Payer: PRIVATE HEALTH INSURANCE | Admitting: Endocrinology

## 2018-11-05 ENCOUNTER — Other Ambulatory Visit: Payer: Self-pay

## 2018-11-05 ENCOUNTER — Encounter: Payer: Self-pay | Admitting: Endocrinology

## 2018-11-05 DIAGNOSIS — I959 Hypotension, unspecified: Secondary | ICD-10-CM

## 2018-11-05 DIAGNOSIS — R000 Tachycardia, unspecified: Secondary | ICD-10-CM | POA: Diagnosis not present

## 2018-11-05 LAB — T4, FREE: Free T4: 0.81 ng/dL (ref 0.60–1.60)

## 2018-11-05 LAB — CORTISOL
Cortisol, Plasma: 8.5 ug/dL
Cortisol, Plasma: 23.2 ug/dL

## 2018-11-05 LAB — TSH: TSH: 0.77 u[IU]/mL (ref 0.35–4.50)

## 2018-11-05 MED ORDER — COSYNTROPIN 0.25 MG IJ SOLR
0.2500 mg | Freq: Once | INTRAMUSCULAR | Status: AC
  Administered 2018-11-05: 0.25 mg via INTRAMUSCULAR

## 2018-11-05 NOTE — Patient Instructions (Signed)
Blood tests are requested for you today.  We'll let you know about the results.  

## 2018-11-05 NOTE — Progress Notes (Signed)
Subjective:    Patient ID: Maria Bond, adult    DOB: Dec 08, 1990, 28 y.o.   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  She has 4 years of intermitt palpitations in the chest, and assoc headache.  she has no h/o HTN, adrenal disease, diabetes, cancer, alcoholism, neurofibromas, hemangiomas, brain aneurysm, cocaine use, menopause, or thyroid disease.  Dx of POTS has been considered, but cardiol said she does not currently meet criteria.  Pt says she has annual routine labs at dr [employer] office.  No recent steroids.   Past Medical History:  Diagnosis Date  . ADHD (attention deficit hyperactivity disorder)   . Allergy   . Anemia   . Anxiety   . Autistic disorder   . Depression   . POTS (postural orthostatic tachycardia syndrome)   . Tachycardia     Past Surgical History:  Procedure Laterality Date  . VAGINOPLASTY  08/26/2015    Social History   Socioeconomic History  . Marital status: Single    Spouse name: Not on file  . Number of children: Not on file  . Years of education: Not on file  . Highest education level: Not on file  Occupational History  . Not on file  Social Needs  . Financial resource strain: Not on file  . Food insecurity    Worry: Not on file    Inability: Not on file  . Transportation needs    Medical: Not on file    Non-medical: Not on file  Tobacco Use  . Smoking status: Never Smoker  . Smokeless tobacco: Never Used  Substance and Sexual Activity  . Alcohol use: No  . Drug use: No  . Sexual activity: Not on file  Lifestyle  . Physical activity    Days per week: Not on file    Minutes per session: Not on file  . Stress: Not on file  Relationships  . Social Herbalist on phone: Not on file    Gets together: Not on file    Attends religious service: Not on file    Active member of club or organization: Not on file    Attends meetings of clubs or organizations: Not on file    Relationship status:  Not on file  . Intimate partner violence    Fear of current or ex partner: Not on file    Emotionally abused: Not on file    Physically abused: Not on file    Forced sexual activity: Not on file  Other Topics Concern  . Not on file  Social History Narrative  . Not on file    Current Outpatient Medications on File Prior to Visit  Medication Sig Dispense Refill  . albuterol (VENTOLIN HFA) 108 (90 Base) MCG/ACT inhaler Inhale 2 puffs into the lungs 2 (two) times daily as needed.    . busPIRone (BUSPAR) 15 MG tablet Take 15 mg by mouth 2 (two) times daily.    . clonazePAM (KLONOPIN) 1 MG disintegrating tablet Take 1 mg by mouth daily.    Marland Kitchen escitalopram (LEXAPRO) 20 MG tablet TAKE 1 TABLET(20 MG) BY MOUTH DAILY (Patient taking differently: Take 20 mg by mouth daily. ) 30 tablet 0  . estradiol (ESTRACE) 2 MG tablet Take 1.5 tablets (3 mg total) by mouth daily. 135 tablet 0  . gabapentin (NEURONTIN) 100 MG capsule Take 300-500 mg by mouth daily as needed.    Marland Kitchen LORazepam (ATIVAN) 2 MG  tablet Take 2 mg by mouth daily as needed for anxiety.    . Multiple Vitamin (MULTIVITAMIN WITH MINERALS) TABS tablet Take 1 tablet by mouth daily.    . propranolol (INDERAL) 10 MG tablet Take 1 tablet (10 mg total) by mouth 2 (two) times daily. Please call to schedule appt with Dr Graciela HusbandsKlein for future refills (1st attempt) 60 tablet 0  . VYVANSE 20 MG capsule Take 40 mg by mouth daily.     No current facility-administered medications on file prior to visit.     Allergies  Allergen Reactions  . Sulfa Antibiotics Hives    Family History  Problem Relation Age of Onset  . Healthy Mother   . Arthritis Father 1138       degenerative disc disease  . Stroke Paternal Grandmother     BP 104/70 (BP Location: Left Arm, Patient Position: Sitting, Cuff Size: Normal)   Pulse (!) 103   Ht 5\' 5"  (1.651 m)   Wt 134 lb (60.8 kg)   SpO2 97%   BMI 22.30 kg/m    Review of Systems denies flushing, pallor, n/v, syncope,  chest pain, anxiety, visual loss, fever, rhinorrhea, and easy bruising.  She has intermitt diarrhea, intermitt sob, anxiety, excessive diaphoresis, arthralgias, and weight gain.       Objective:   Physical Exam VS: see vs page GEN: no distress.  In wheelchair.   HEAD: head: no deformity eyes: no periorbital swelling, no proptosis external nose and ears are normal mouth: no lesion seen.  No neurofibromata.   NECK: supple, thyroid is not enlarged.  CHEST WALL: no deformity LUNGS: clear to auscultation CV: reg rate and rhythm, no murmur ABD: abdomen is soft, nontender.  no hepatosplenomegaly.  not distended.  no hernia MUSCULOSKELETAL: muscle bulk and strength are grossly normal.  no obvious joint swelling.  gait is normal and steady EXTEMITIES: no deformity.  no edema PULSES: no carotid bruit NEURO:  cn 2-12 grossly intact.   readily moves all 4's.  sensation is intact to touch on all 4's SKIN:  Normal texture and temperature.  No rash or suspicious lesion is visible.   NODES:  None palpable at the neck PSYCH: alert, well-oriented.  Does not appear anxious nor depressed.  I have reviewed outside records, and summarized: Pt was noted to have elevated HR, and referred here.  Note from Dr Sandi MealyAlm details pt's h/o ADHD and anxiety.  ACTH stimulation test is done: baseline cortisol level=8 then Cosyntropin 250 mcg is given im 45 minutes later, cortisol level=23 (normal response)     Assessment & Plan:  Palpitations, new, uncertain etiology.  HPA insuff is excluded.    Patient Instructions  Blood tests are requested for you today.  We'll let you know about the results.

## 2018-11-09 ENCOUNTER — Other Ambulatory Visit: Payer: Self-pay

## 2018-11-09 DIAGNOSIS — R000 Tachycardia, unspecified: Secondary | ICD-10-CM

## 2018-11-09 IMAGING — DX DG CERVICAL SPINE COMPLETE 4+V
5 series · 5 of 5 positions shown · non-contrast
Comparison: None.

CLINICAL DATA: Right cervical radiculopathy

EXAM:
CERVICAL SPINE - COMPLETE 4+ VIEW

[c-spine lat]
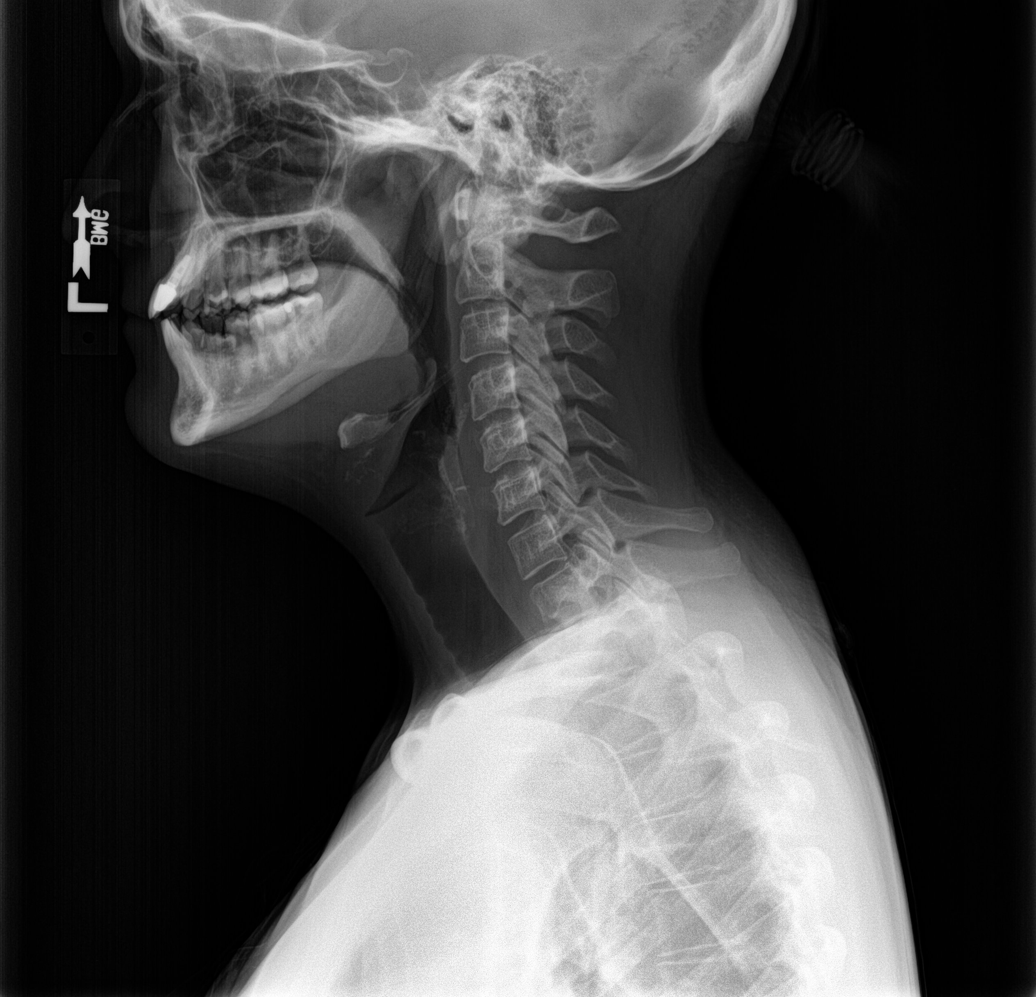

[c-spine obl (1 of 2)]
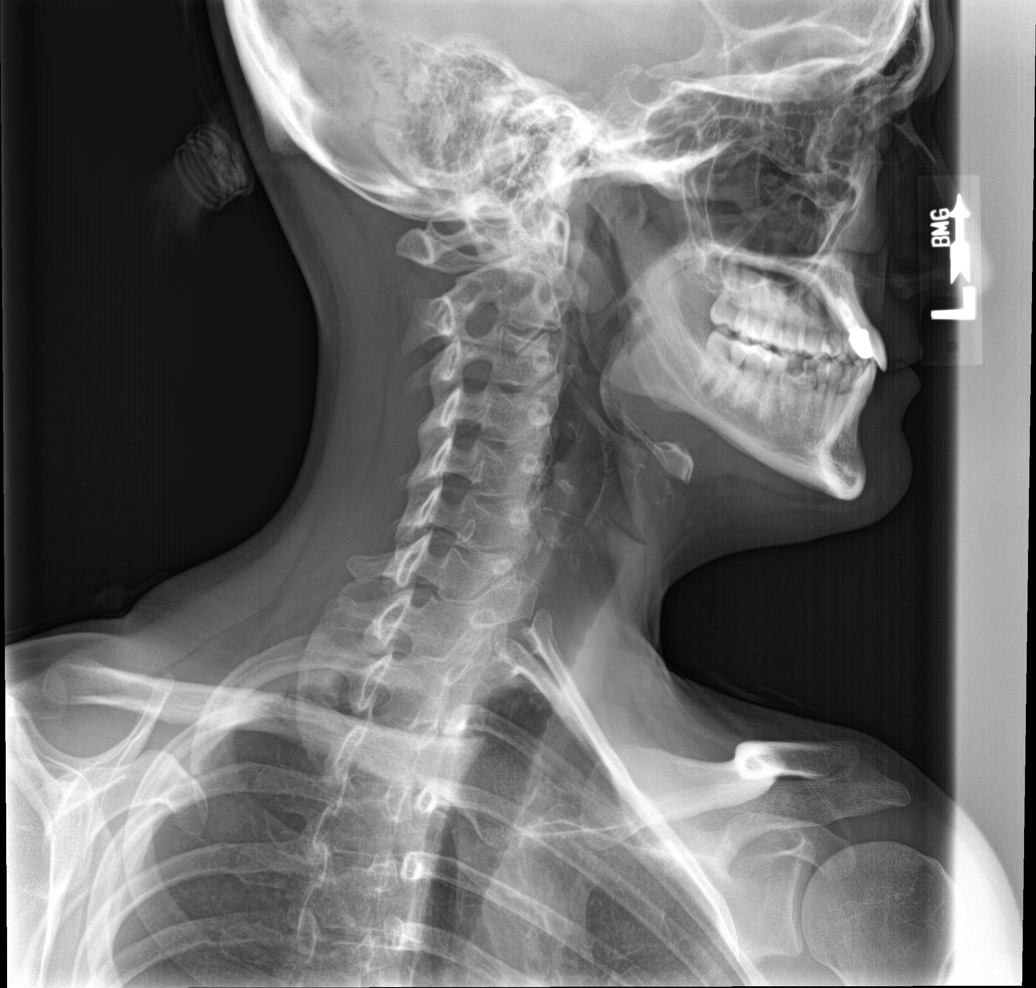

[c-spine obl (2 of 2)]
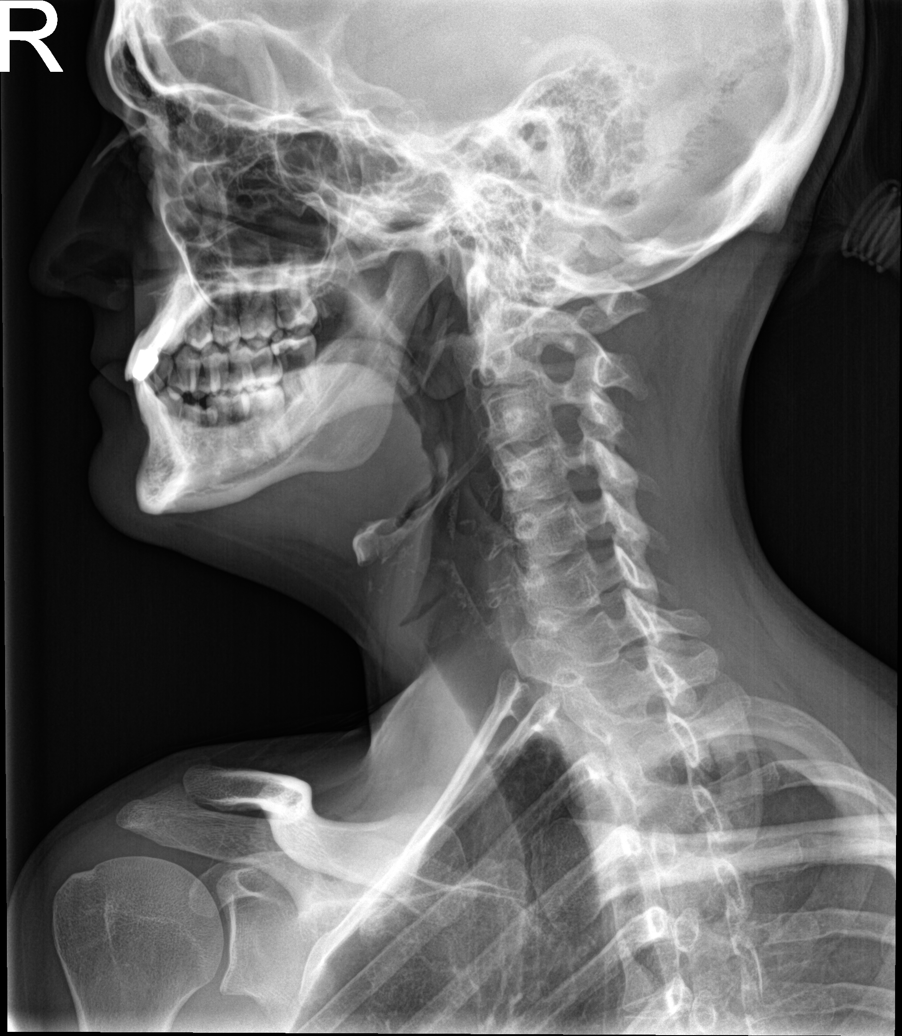

[c-spine ap]
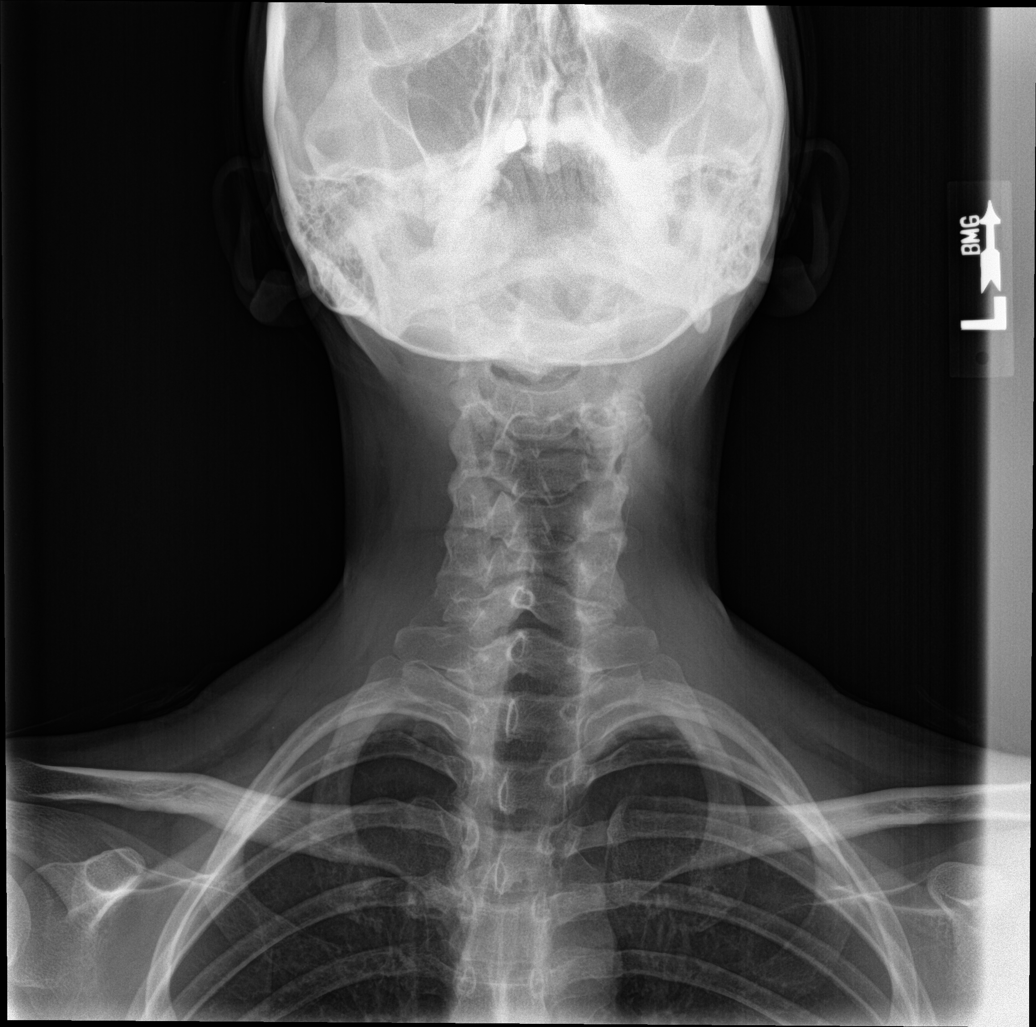

[c-spine open mouth]
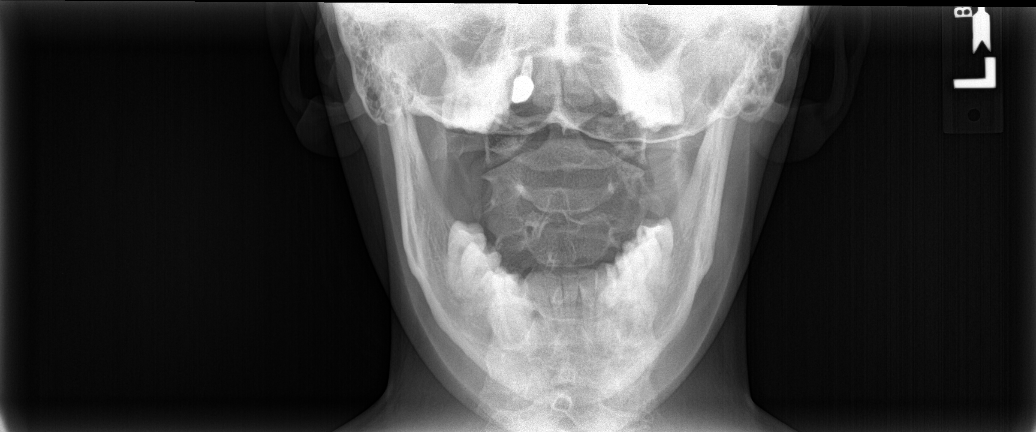

[5 of 5 positions shown; findings below may reference images not displayed]

FINDINGS: C1 to the superior endplate of T2 is imaged on the provided lateral
radiograph.

Normal alignment of the cervical spine. Bilateral facets appear
normally aligned. The dens is normally positioned between the
lateral masses of C1.

Cervical vertebral body heights appear preserved. Prevertebral soft
tissues appear normal.

Cervical intervertebral disc space heights appear preserved. The
bilateral neural foramina appear widely patent.

Regional soft tissues appear normal.

Limited visualization of the lung apices is normal.
IMPRESSION: Normal radiographs of the cervical spine.

## 2018-11-10 ENCOUNTER — Other Ambulatory Visit: Payer: Self-pay

## 2018-11-12 LAB — METANEPHRINES, PLASMA
Metanephrine, Free: 64 pg/mL — ABNORMAL HIGH (ref <=57)
Normetanephrine, Free: 75 pg/mL (ref <=148)
Total Metanephrines-Plasma: 139 pg/mL (ref <=205)

## 2018-11-12 LAB — CATECHOLAMINES, FRACTIONATED, PLASMA
Dopamine: 18 pg/mL
Epinephrine: 122 pg/mL — ABNORMAL HIGH
Norepinephrine: 337 pg/mL
Total Catecholamines: 477 pg/mL

## 2018-11-12 LAB — CALCITONIN: Calcitonin: <2 pg/mL (ref <=5)

## 2018-11-16 LAB — CATECHOLAMINES, FRACTIONATED, URINE, 24 HOUR
Calc Total (E+NE): 22 mcg/24 h — ABNORMAL LOW (ref 26–121)
Creatinine, Urine mg/day-CATEUR: 0.71 g/(24.h) (ref 0.50–2.15)
Dopamine 24 Hr Urine: 132 mcg/24 h (ref 52–480)
Epinephrine, 24H, Ur: 4 mcg/24 h (ref 2–24)
Norepinephrine, 24H, Ur: 18 mcg/24 h (ref 15–100)
Total Volume: 1150 mL

## 2018-11-16 LAB — METANEPHRINES, URINE, 24 HOUR
Metaneph Total, Ur: 285 mcg/24 h (ref 94–604)
Metanephrines, Ur: 129 mcg/24 h (ref 25–222)
Normetanephrine, 24H Ur: 156 mcg/24 h (ref 40–412)
Volume, Urine-VMAUR: 1150 mL

## 2019-01-12 ENCOUNTER — Encounter: Payer: Self-pay | Admitting: Gastroenterology

## 2019-02-10 ENCOUNTER — Ambulatory Visit (INDEPENDENT_AMBULATORY_CARE_PROVIDER_SITE_OTHER): Payer: PRIVATE HEALTH INSURANCE | Admitting: Gastroenterology

## 2019-02-10 ENCOUNTER — Encounter: Payer: Self-pay | Admitting: Gastroenterology

## 2019-02-10 VITALS — BP 110/80 | HR 84 | Temp 98.1°F | Ht 65.0 in | Wt 132.8 lb

## 2019-02-10 DIAGNOSIS — K625 Hemorrhage of anus and rectum: Secondary | ICD-10-CM

## 2019-02-10 DIAGNOSIS — Z1159 Encounter for screening for other viral diseases: Secondary | ICD-10-CM | POA: Diagnosis not present

## 2019-02-10 DIAGNOSIS — K582 Mixed irritable bowel syndrome: Secondary | ICD-10-CM | POA: Diagnosis not present

## 2019-02-10 MED ORDER — NA SULFATE-K SULFATE-MG SULF 17.5-3.13-1.6 GM/177ML PO SOLN: 1.0000 | Freq: Once | ORAL | 0 refills | Status: AC

## 2019-02-10 NOTE — Patient Instructions (Signed)
Start over the counter Citracel with a small scoop daily increasing to a heaping scoop daily over several weeks.   You have been scheduled for a colonoscopy. Please follow written instructions given to you at your visit today.  Please pick up your prep supplies at the pharmacy within the next 1-3 days. If you use inhalers (even only as needed), please bring them with you on the day of your procedure.

## 2019-02-10 NOTE — Progress Notes (Signed)
HPI: This is a very pleasant 28 year old woman who was referred to me by Patria Mane, MD  to evaluate intermittent abdominal pains, rectal bleeding.    Chief complaint is intermittent abdominal pains, rectal bleeding  Since she was very young she has had issues with abdominal cramping and diarrhea.  Cramping tends to be in her right upper quadrant.  She has never seen blood in her stool until just the past 6 months.  4 times now she has had episodes of the cramping, diarrhea and towards the end of the diarrhea she will see blood in her stool.  She does not have any rectal pains.  Between these episodes she tends to have mild constipation.  She has never taken fiber supplements or any other medicines for her mild constipation.  She will sometimes have to go only every 2 or 3 days.  During this time she will have to push and strain quite a bit to move her bowels.  She believes that anxiety provoking situations tend to bring on these episodes.  Previously she was told this was related to IBS.  She is concerned about the blood that she is seeing.  Colon cancer does not run in her family  Maria Bond had a sex change operation in 2017  She is in a wheelchair but can transfer easily by herself she can actually walk around short distances however she is walking for extended periods of time she will get very very fatigued.     Review of systems: Pertinent positive and negative review of systems were noted in the above HPI section. All other review negative.   Past Medical History:  Diagnosis Date  . ADHD (attention deficit hyperactivity disorder)   . Allergy   . Anemia   . Anxiety   . Autistic disorder   . Depression   . POTS (postural orthostatic tachycardia syndrome)   . Tachycardia     Past Surgical History:  Procedure Laterality Date  . VAGINOPLASTY  08/26/2015    Current Outpatient Medications  Medication Sig Dispense Refill  . albuterol (VENTOLIN HFA) 108 (90 Base) MCG/ACT inhaler  Inhale 2 puffs into the lungs 2 (two) times daily as needed.    . busPIRone (BUSPAR) 15 MG tablet Take 15 mg by mouth 2 (two) times daily.    . clonazePAM (KLONOPIN) 1 MG disintegrating tablet Take 1 mg by mouth daily.    . cyclobenzaprine (FLEXERIL) 10 MG tablet Take 10 mg by mouth 3 (three) times daily as needed for muscle spasms.    Marland Kitchen desvenlafaxine (PRISTIQ) 100 MG 24 hr tablet Take 100 mg by mouth daily.    Marland Kitchen estradiol (ESTRACE) 2 MG tablet Take 1.5 tablets (3 mg total) by mouth daily. 135 tablet 0  . gabapentin (NEURONTIN) 100 MG capsule Take 300-500 mg by mouth daily as needed.    Marland Kitchen LORazepam (ATIVAN) 2 MG tablet Take 2 mg by mouth daily as needed for anxiety.    . Multiple Vitamin (MULTIVITAMIN WITH MINERALS) TABS tablet Take 1 tablet by mouth daily.    . propranolol (INDERAL) 10 MG tablet Take 1 tablet (10 mg total) by mouth 2 (two) times daily. Please call to schedule appt with Dr Caryl Comes for future refills (1st attempt) 60 tablet 0  . VYVANSE 20 MG capsule Take 40 mg by mouth daily.     No current facility-administered medications for this visit.     Allergies as of 02/10/2019 - Review Complete 02/10/2019  Allergen Reaction Noted  . Sulfa  antibiotics Hives 12/30/2015    Family History  Problem Relation Age of Onset  . Healthy Mother   . Celiac disease Mother   . Irritable bowel syndrome Mother   . Arthritis Father 13       degenerative disc disease  . Heart disease Father   . Stroke Paternal Grandmother   . Heart disease Paternal Grandfather   . Colon polyps Maternal Aunt   . Irritable bowel syndrome Maternal Aunt     Social History   Socioeconomic History  . Marital status: Single    Spouse name: Not on file  . Number of children: Not on file  . Years of education: Not on file  . Highest education level: Not on file  Occupational History  . Not on file  Social Needs  . Financial resource strain: Not on file  . Food insecurity    Worry: Not on file     Inability: Not on file  . Transportation needs    Medical: Not on file    Non-medical: Not on file  Tobacco Use  . Smoking status: Never Smoker  . Smokeless tobacco: Never Used  Substance and Sexual Activity  . Alcohol use: No  . Drug use: No  . Sexual activity: Not on file  Lifestyle  . Physical activity    Days per week: Not on file    Minutes per session: Not on file  . Stress: Not on file  Relationships  . Social Musician on phone: Not on file    Gets together: Not on file    Attends religious service: Not on file    Active member of club or organization: Not on file    Attends meetings of clubs or organizations: Not on file    Relationship status: Not on file  . Intimate partner violence    Fear of current or ex partner: Not on file    Emotionally abused: Not on file    Physically abused: Not on file    Forced sexual activity: Not on file  Other Topics Concern  . Not on file  Social History Narrative  . Not on file     Physical Exam: BP 110/80   Pulse 84   Temp 98.1 F (36.7 C)   Ht 5\' 5"  (1.651 m)   Wt 132 lb 12.8 oz (60.2 kg)   BMI 22.10 kg/m  Constitutional: generally well-appearing Psychiatric: alert and oriented x3 Eyes: extraocular movements intact Mouth: oral pharynx moist, no lesions Neck: supple no lymphadenopathy Cardiovascular: heart regular rate and rhythm Lungs: clear to auscultation bilaterally Abdomen: soft, nontender, nondistended, no obvious ascites, no peritoneal signs, normal bowel sounds Extremities: no lower extremity edema bilaterally Skin: no lesions on visible extremities   Assessment and plan: 28 y.o. 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  The minor rectal bleeding is most likely innocent anorectal origin however certainly proceeding with colonoscopy at her soonest convenience is reasonable to exclude other potential causes such as neoplasia,  inflammatory bowel disease.  In the meantime she is given start daily fiber supplement.  Help her mild constipation see if that can in turn help these episodes of cramping and diarrhea.    Please see the "Patient Instructions" section for addition details about the plan.   30, MD [facility] 02/10/2019, 10:50 AM  Cc: 13/07/2018, MD

## 2019-02-16 ENCOUNTER — Encounter (HOSPITAL_COMMUNITY): Payer: Self-pay | Admitting: Emergency Medicine

## 2019-02-16 ENCOUNTER — Emergency Department (HOSPITAL_COMMUNITY): Payer: No Typology Code available for payment source

## 2019-02-16 ENCOUNTER — Other Ambulatory Visit: Payer: Self-pay

## 2019-02-16 ENCOUNTER — Emergency Department (HOSPITAL_COMMUNITY)
Admission: EM | Admit: 2019-02-16 | Discharge: 2019-02-17 | Discharge status: Against Medical Advice | Payer: No Typology Code available for payment source | Attending: Emergency Medicine | Admitting: Emergency Medicine

## 2019-02-16 ENCOUNTER — Inpatient Hospital Stay
Admission: RE | Admit: 2019-02-16 | Discharge: 2019-02-16 | Disposition: A | Discharge status: Home / Self Care | Payer: PRIVATE HEALTH INSURANCE | Source: Ambulatory Visit

## 2019-02-16 ENCOUNTER — Ambulatory Visit (INDEPENDENT_AMBULATORY_CARE_PROVIDER_SITE_OTHER)
Admission: EM | Admit: 2019-02-16 | Discharge: 2019-02-16 | Disposition: A | Discharge status: Home / Self Care | Payer: No Typology Code available for payment source | Source: Home / Self Care

## 2019-02-16 DIAGNOSIS — Z5321 Procedure and treatment not carried out due to patient leaving prior to being seen by health care provider: Secondary | ICD-10-CM | POA: Insufficient documentation

## 2019-02-16 DIAGNOSIS — R1031 Right lower quadrant pain: Secondary | ICD-10-CM

## 2019-02-16 LAB — COMPREHENSIVE METABOLIC PANEL
ALT: 27 U/L (ref 0–44)
AST: 21 U/L (ref 15–41)
Albumin: 4.5 g/dL (ref 3.5–5.0)
Alkaline Phosphatase: 68 U/L (ref 38–126)
Anion gap: 9 (ref 5–15)
BUN: 9 mg/dL (ref 6–20)
CO2: 26 mmol/L (ref 22–32)
Calcium: 9.2 mg/dL (ref 8.9–10.3)
Chloride: 101 mmol/L (ref 98–111)
Creatinine, Ser: 0.76 mg/dL (ref 0.44–1.00)
GFR calc Af Amer: >60 mL/min (ref >60)
GFR calc non Af Amer: >60 mL/min (ref >60)
Glucose, Bld: 89 mg/dL (ref 70–99)
Potassium: 3.9 mmol/L (ref 3.5–5.1)
Sodium: 136 mmol/L (ref 135–145)
Total Bilirubin: 0.3 mg/dL (ref 0.3–1.2)
Total Protein: 7.1 g/dL (ref 6.5–8.1)

## 2019-02-16 LAB — POCT URINALYSIS DIP (DEVICE)
Bilirubin Urine: NEGATIVE
Glucose, UA: NEGATIVE mg/dL
Hgb urine dipstick: NEGATIVE
Ketones, ur: NEGATIVE mg/dL
Leukocytes,Ua: NEGATIVE
Nitrite: NEGATIVE
Protein, ur: NEGATIVE mg/dL
Specific Gravity, Urine: 1.02 (ref 1.005–1.030)
Urobilinogen, UA: 0.2 mg/dL (ref 0.0–1.0)
pH: 7.5 (ref 5.0–8.0)

## 2019-02-16 LAB — CBC
HCT: 41.2 % (ref 36.0–46.0)
Hemoglobin: 13.7 g/dL (ref 12.0–15.0)
MCH: 29.4 pg (ref 26.0–34.0)
MCHC: 33.3 g/dL (ref 30.0–36.0)
MCV: 88.4 fL (ref 80.0–100.0)
Platelets: 368 10*3/uL (ref 150–400)
RBC: 4.66 MIL/uL (ref 3.87–5.11)
RDW: 11.9 % (ref 11.5–15.5)
WBC: 10.3 10*3/uL (ref 4.0–10.5)
nRBC: 0 % (ref 0.0–0.2)

## 2019-02-16 LAB — URINALYSIS, ROUTINE W REFLEX MICROSCOPIC
Bilirubin Urine: NEGATIVE
Glucose, UA: NEGATIVE mg/dL
Hgb urine dipstick: NEGATIVE
Ketones, ur: NEGATIVE mg/dL
Leukocytes,Ua: NEGATIVE
Nitrite: NEGATIVE
Protein, ur: NEGATIVE mg/dL
Specific Gravity, Urine: 1.016 (ref 1.005–1.030)
pH: 6 (ref 5.0–8.0)

## 2019-02-16 LAB — I-STAT BETA HCG BLOOD, ED (MC, WL, AP ONLY): I-stat hCG, quantitative: <5 m[IU]/mL (ref <5)

## 2019-02-16 LAB — LIPASE, BLOOD: Lipase: 24 U/L (ref 11–51)

## 2019-02-16 MED ORDER — IOHEXOL 300 MG/ML  SOLN
100.0000 mL | Freq: Once | INTRAMUSCULAR | Status: AC | PRN
  Administered 2019-02-16: 20:00:00 100 mL via INTRAVENOUS

## 2019-02-16 MED ORDER — SODIUM CHLORIDE 0.9% FLUSH: 3.0000 mL | Freq: Once | INTRAVENOUS | Status: DC

## 2019-02-16 NOTE — ED Provider Notes (Signed)
[facility]    CSN: [id number] Arrival date & time: 02/16/19  1656      History   Chief Complaint Chief Complaint  Patient presents with  . Abdominal Pain    HPI Maria Bond is a 28 y.o.transgender  adult. who presents with RUQ which started  fraduially when she would lay at 45 degrees. This am at 8 was very severe, 7/10 and is different than her IBS pain. And it hit her again around 3pm.  Alleviated when sitting at 90 degree angkle, and provoked with reclining aroubnd 3 pm. She sat up. Pain is also provoked with hitting bumps.  Has not had appetite today. Denies fever, or nausea.  Last BM which was small and as at was noon today. Prior to this was yesterday. Has not had colitis since last month. At that time she was passing blood clots at the end. She saw GI but did not get a rectal exam or anoscopy, they just scheduled her to have a colonoscopy next week.  She has been having cloudy urine off and on x 2 months. She voids on her own.     Past Medical History:  Diagnosis Date  . ADHD (attention deficit hyperactivity disorder)   . Allergy   . Anemia   . Anxiety   . Autistic disorder   . Depression   . POTS (postural orthostatic tachycardia syndrome)   . Tachycardia     Patient Active Problem List   Diagnosis Date Noted  . Tachycardia 11/05/2018  . Hypotension 11/05/2018  . Female-to-female transgender person 01/12/2018  . Radicular pain in right arm 12/05/2017  . Right cervical radiculopathy 12/05/2017  . Need for prophylactic vaccination and inoculation against influenza 12/05/2017  . Depression 04/27/2014  . Asperger's disorder 12/06/2011  . Anxiety 12/06/2011    Past Surgical History:  Procedure Laterality Date  . VAGINOPLASTY  08/26/2015    OB History   No obstetric history on file.       Home Medications    Prior to Admission medications   Medication Sig Start Date End Date Taking? Authorizing Provider  albuterol (VENTOLIN HFA) 108  (90 Base) MCG/ACT inhaler Inhale 2 puffs into the lungs 2 (two) times daily as needed. 07/27/18   [provider]  busPIRone (BUSPAR) 15 MG tablet Take 15 mg by mouth 2 (two) times daily.    [provider]  clonazePAM (KLONOPIN) 1 MG disintegrating tablet Take 1 mg by mouth daily. 10/07/18   [provider]  cyclobenzaprine (FLEXERIL) 10 MG tablet Take 10 mg by mouth 3 (three) times daily as needed for muscle spasms.    [provider]  desvenlafaxine (PRISTIQ) 100 MG 24 hr tablet Take 100 mg by mouth daily. 02/01/19   [provider]  estradiol (ESTRACE) 2 MG tablet Take 1.5 tablets (3 mg total) by mouth daily. 11/12/17   Sherren MochaShaw, Eva N, MD  gabapentin (NEURONTIN) 100 MG capsule Take 300-500 mg by mouth daily as needed. 10/06/18   [provider]  LORazepam (ATIVAN) 2 MG tablet Take 2 mg by mouth daily as needed for anxiety.    [provider]  Multiple Vitamin (MULTIVITAMIN WITH MINERALS) TABS tablet Take 1 tablet by mouth daily.    [provider]  propranolol (INDERAL) 10 MG tablet Take 1 tablet (10 mg total) by mouth 2 (two) times daily. Please call to schedule appt with Dr Graciela HusbandsKlein for future refills (1st attempt) 06/05/18   Duke SalviaKlein, Steven C, MD  VYVANSE 20 MG capsule Take 40 mg by mouth daily. 10/20/18   [provider]    Family History Family History  Problem Relation Age of Onset  . Healthy Mother   . Celiac disease Mother   . Irritable bowel syndrome Mother   . Arthritis Father 68       degenerative disc disease  . Heart disease Father   . Stroke Paternal Grandmother   . Heart disease Paternal Grandfather   . Colon polyps Maternal Aunt   . Irritable bowel syndrome Maternal Aunt     Social History Social History   Tobacco Use  . Smoking status: Never Smoker  . Smokeless tobacco: Never Used  Substance Use Topics  . Alcohol use: No  . Drug use: No     Allergies   Sulfa antibiotics   Review of  Systems Review of Systems  Constitutional: Positive for appetite change. Negative for chills and fever.  Respiratory: Negative for cough and shortness of breath.   Gastrointestinal: Positive for abdominal pain. Negative for abdominal distention, constipation, diarrhea, nausea, rectal pain and vomiting.  Genitourinary: Positive for flank pain. Negative for dysuria, frequency, hematuria and urgency.       + cloudy urine  Musculoskeletal: Positive for gait problem.       Is wheelchair bound  Skin: Negative for rash.     Physical Exam Triage Vital Signs ED Triage Vitals  Enc Vitals Group     BP 02/16/19 1738 110/75     Pulse Rate 02/16/19 1738 86     Resp 02/16/19 1738 18     Temp 02/16/19 1738 97.7 F (36.5 C)     Temp src --      SpO2 02/16/19 1738 100 %     Weight --      Height --      Head Circumference --      Peak Flow --      Pain Score 02/16/19 1742 2     Pain Loc --      Pain Edu? --      Excl. in GC? --    No data found.  Updated Vital Signs BP 110/75   Pulse 86   Temp 97.7 F (36.5 C)   Resp 18   SpO2 100%   Visual Acuity Right Eye Distance:   Left Eye Distance:   Bilateral Distance:    Right Eye Near:   Left Eye Near:    Bilateral Near:     Physical Exam Vitals signs and nursing note reviewed.  Constitutional:      General: She is not in acute distress.    Appearance: She is not ill-appearing, toxic-appearing or diaphoretic.  HENT:     Right Ear: External ear normal.     Left Ear: External ear normal.  Eyes:     General: No scleral icterus.    Conjunctiva/sclera: Conjunctivae normal.  Neck:     Musculoskeletal: Neck supple.  Pulmonary:     Effort: Pulmonary effort is normal.  Abdominal:     General: Abdomen is flat. There is no distension.     Palpations: Abdomen is soft. There is no mass.     Tenderness: There is abdominal tenderness. There is guarding. There is no right CVA tenderness, left CVA tenderness or rebound.  Musculoskeletal:      Comments: Has normal ROM of upper extremities.   Skin:    General: Skin is warm and dry.     Findings: No rash.  Neurological:     Mental Status: She is alert and oriented to person, place, and time.  Psychiatric:        Mood and Affect: Mood normal.        Behavior: Behavior normal.        Thought Content: Thought content normal.        Judgment: Judgment normal.      UC Treatments / Results  Labs (all labs ordered are listed, but only abnormal results are displayed) Labs Reviewed - No data to display  EKG   Radiology No results found.  Procedures Procedures (including critical care time)  Medications Ordered in UC Medications - No data to display  Initial Impression / Assessment and Plan / UC Course  I have reviewed the triage vital signs and the nursing notes. Pertinent labs results that were available during my care of the patient were reviewed by me and considered in my medical decision making (see chart for details).     Final Clinical Impressions(s) / UC Diagnoses   Final diagnoses:  None   Discharge Instructions   None    ED Prescriptions    None     PDMP not reviewed this encounter.   Shelby Mattocks, PA-C 02/16/19 1825

## 2019-02-16 NOTE — ED Triage Notes (Signed)
Pt c/o RLQ abdominal pain, states she has colitis and also has a colonoscopy next week. Hx of IBS.states shes had the mild pain for several weeks and the pain became significant today. Some R flank pain tenderness. Sees a gastroenterologist.

## 2019-02-16 NOTE — ED Triage Notes (Signed)
Pt sent here from UC for concerns of appendicitis. Complains of right lower abd pain for several weeks- pain significantly increased today. Denies n/v.

## 2019-02-16 NOTE — ED Notes (Signed)
Pt stated she is feeling very anxious ands wishes to leave. Tech removed IV so pt could leave

## 2019-02-16 NOTE — Discharge Instructions (Addendum)
Please go to the emergency room to have further testing, since you are guarded on the area where your appendix is. You need to have more tests than we can do here. I am concerned this pain could be from your appendix.

## 2019-02-19 ENCOUNTER — Encounter: Payer: Self-pay | Admitting: Gastroenterology

## 2019-02-22 ENCOUNTER — Other Ambulatory Visit: Payer: Self-pay | Admitting: Gastroenterology

## 2019-02-22 ENCOUNTER — Ambulatory Visit (INDEPENDENT_AMBULATORY_CARE_PROVIDER_SITE_OTHER): Payer: PRIVATE HEALTH INSURANCE

## 2019-02-22 ENCOUNTER — Encounter: Payer: Self-pay | Admitting: *Deleted

## 2019-02-22 DIAGNOSIS — Z1159 Encounter for screening for other viral diseases: Secondary | ICD-10-CM

## 2019-02-23 LAB — SARS CORONAVIRUS 2 (TAT 6-24 HRS): SARS Coronavirus 2: NEGATIVE

## 2019-02-24 ENCOUNTER — Other Ambulatory Visit: Payer: Self-pay

## 2019-02-24 ENCOUNTER — Other Ambulatory Visit: Payer: Self-pay | Admitting: Gastroenterology

## 2019-02-24 ENCOUNTER — Ambulatory Visit (AMBULATORY_SURGERY_CENTER): Payer: PRIVATE HEALTH INSURANCE | Admitting: Gastroenterology

## 2019-02-24 ENCOUNTER — Encounter: Payer: Self-pay | Admitting: Gastroenterology

## 2019-02-24 VITALS — BP 91/64 | HR 83 | Temp 97.5°F | Resp 15 | Ht 65.0 in | Wt 132.0 lb

## 2019-02-24 DIAGNOSIS — K625 Hemorrhage of anus and rectum: Secondary | ICD-10-CM | POA: Diagnosis not present

## 2019-02-24 MED ORDER — SODIUM CHLORIDE 0.9 % IV SOLN: 500.0000 mL | Freq: Once | INTRAVENOUS | Status: DC

## 2019-02-24 NOTE — Patient Instructions (Signed)
YOU HAD AN ENDOSCOPIC PROCEDURE TODAY AT THE [facility]:   Refer to the procedure report that was given to you for any specific questions about what was found during the examination.  If the procedure report does not answer your questions, please call your gastroenterologist to clarify.  If you requested that your care partner not be given the details of your procedure findings, then the procedure report has been included in a sealed envelope for you to review at your convenience later.  YOU SHOULD EXPECT: Some feelings of bloating in the abdomen. Passage of more gas than usual.  Walking can help get rid of the air that was put into your GI tract during the procedure and reduce the bloating. If you had a lower endoscopy (such as a colonoscopy or flexible sigmoidoscopy) you may notice spotting of blood in your stool or on the toilet paper. If you underwent a bowel prep for your procedure, you may not have a normal bowel movement for a few days.  Please Note:  You might notice some irritation and congestion in your nose or some drainage.  This is from the oxygen used during your procedure.  There is no need for concern and it should clear up in a day or so.  SYMPTOMS TO REPORT IMMEDIATELY:   Following lower endoscopy (colonoscopy or flexible sigmoidoscopy):  Excessive amounts of blood in the stool  Significant tenderness or worsening of abdominal pains  Swelling of the abdomen that is new, acute  Fever of 100F or higher   For urgent or emergent issues, a gastroenterologist can be reached at any hour by calling [phone].   DIET:  We do recommend a small meal at first, but then you may proceed to your regular diet.  Drink plenty of fluids but you should avoid alcoholic beverages for 24 hours.  MEDICATIONS: Continue present medications.  Please see handouts given to you by your recovery nurse.  ACTIVITY:  You should plan to take it easy for the rest of today and you should  NOT DRIVE or use heavy machinery until tomorrow (because of the sedation medicines used during the test).    FOLLOW UP: Our staff will call the number listed on your records 48-72 hours following your procedure to check on you and address any questions or concerns that you may have regarding the information given to you following your procedure. If we do not reach you, we will leave a message.  We will attempt to reach you two times.  During this call, we will ask if you have developed any symptoms of COVID 19. If you develop any symptoms (ie: fever, flu-like symptoms, shortness of breath, cough etc.) before then, please call [phone].  If you test positive for Covid 19 in the 2 weeks post procedure, please call and report this information to us.    If any biopsies were taken you will be contacted by phone or by letter within the next 1-3 weeks.  Please call us at [phone] if you have not heard about the biopsies in 3 weeks.   Thank you for allowing us to provide for your healthcare needs today.   SIGNATURES/CONFIDENTIALITY: You and/or your care partner have signed paperwork which will be entered into your electronic medical record.  These signatures attest to the fact that that the information above on your After Visit Summary has been reviewed and is understood.  Full responsibility of the confidentiality of this discharge information lies with you and/or   your care-partner. 

## 2019-02-24 NOTE — Progress Notes (Signed)
Called to room to assist during endoscopic procedure.  Patient ID and intended procedure confirmed with present staff. Received instructions for my participation in the procedure from the performing physician.  

## 2019-02-24 NOTE — Progress Notes (Signed)
PT taken to PACU. Monitors in place. VSS. Report given to RN. 

## 2019-02-24 NOTE — Op Note (Signed)
[facility] Patient Name: Maria Bond Procedure Date: 02/24/2019 11:14 AM MRN: [id number] Endoscopist: Rachael Fee , MD Age: 28 Referring MD:  Date of Birth: Nov 01, 1990 Gender: Female Account #: [id number] Procedure:                Colonoscopy Indications:              Rectal bleeding Medicines:                Monitored Anesthesia Care Procedure:                Pre-Anesthesia Assessment:                           - Prior to the procedure, a History and Physical                            was performed, and patient medications and                            allergies were reviewed. The patient's tolerance of                            previous anesthesia was also reviewed. The risks                            and benefits of the procedure and the sedation                            options and risks were discussed with the patient.                            All questions were answered, and informed consent                            was obtained. Prior Anticoagulants: The patient has                            taken no previous anticoagulant or antiplatelet                            agents. ASA Grade Assessment: II - A patient with                            mild systemic disease. After reviewing the risks                            and benefits, the patient was deemed in                            satisfactory condition to undergo the procedure.                           After obtaining informed consent, the colonoscope  was passed under direct vision. Throughout the                            procedure, the patient's blood pressure, pulse, and                            oxygen saturations were monitored continuously. The                            Colonoscope was introduced through the anus and                            advanced to the the cecum, identified by                            appendiceal orifice and ileocecal valve. The                        colonoscopy was performed without difficulty. The                            patient tolerated the procedure well. The quality                            of the bowel preparation was good. The ileocecal                            valve, appendiceal orifice, and rectum were                            photographed. Scope In: 11:24:57 AM Scope Out: 11:35:01 AM Scope Withdrawal Time: 0 hours 6 minutes 35 seconds  Total Procedure Duration: 0 hours 10 minutes 4 seconds  Findings:                 The distal rectal mucosa was slightly erthyematous,                            biopsies taken to check for acute/chronic                            inflammation.                           The exam was otherwise without abnormality on                            direct and retroflexion views. Complications:            No immediate complications. Estimated blood loss:                            Minimal. Estimated Blood Loss:     Estimated blood loss: none. Impression:               - The distal rectal mucosa was slightly  erthyematous, biopsies taken to check for                            acute/chronic inflammation.                           - The examination was otherwise normal on direct                            and retroflexion views. Recommendation:           - Patient has a contact number available for                            emergencies. The signs and symptoms of potential                            delayed complications were discussed with the                            patient. Return to normal activities tomorrow.                            Written discharge instructions were provided to the                            patient.                           - Resume previous diet.                           - Continue present medications.                           - Await pathology results. Rachael Feeaniel P , MD 02/24/2019 11:40:22 AM This report  has been signed electronically.

## 2019-02-24 NOTE — Progress Notes (Signed)
CT scan during ED visit last week.  Scan was negative.  Abdominal pain was reason for visit.  LC - temp CW - vitals

## 2019-02-26 ENCOUNTER — Telehealth: Payer: Self-pay | Admitting: *Deleted

## 2019-02-26 ENCOUNTER — Telehealth: Payer: Self-pay

## 2019-02-26 NOTE — Telephone Encounter (Signed)
  Follow up Call-  Call back number 02/24/2019  Post procedure Call Back phone  # [phone]  Permission to leave phone message Yes  Some recent data might be hidden     Patient questions:  Do you have a fever, pain , or abdominal swelling? No. Pain Score  0 *  Have you tolerated food without any problems? Yes.    Have you been able to return to your normal activities? Yes.    Do you have any questions about your discharge instructions: Diet   No. Medications  No. Follow up visit  No.  Do you have questions or concerns about your Care? No.  Actions: * If pain score is 4 or above: No action needed, pain <4.  Have you developed a fever since your procedure? no 2.   Have you had an respiratory symptoms (SOB or cough) since your procedure? no  3.   Have you tested positive for COVID 19 since your procedure no  4.   Have you had any family members/close contacts diagnosed with the COVID 19 since your procedure?  no   If yes to any of these questions please route to Joylene John, RN and Alphonsa Gin, Therapist, sports.

## 2019-02-26 NOTE — Telephone Encounter (Signed)
LVM

## 2019-03-06 ENCOUNTER — Encounter: Payer: Self-pay | Admitting: Gastroenterology

## 2019-03-24 ENCOUNTER — Other Ambulatory Visit: Payer: Self-pay

## 2019-03-24 ENCOUNTER — Emergency Department (HOSPITAL_COMMUNITY): Payer: PRIVATE HEALTH INSURANCE

## 2019-03-24 ENCOUNTER — Emergency Department (HOSPITAL_COMMUNITY)
Admission: EM | Admit: 2019-03-24 | Discharge: 2019-03-24 | Disposition: A | Discharge status: Home / Self Care | Payer: PRIVATE HEALTH INSURANCE | Attending: Emergency Medicine | Admitting: Emergency Medicine

## 2019-03-24 DIAGNOSIS — Z79899 Other long term (current) drug therapy: Secondary | ICD-10-CM | POA: Diagnosis not present

## 2019-03-24 DIAGNOSIS — R109 Unspecified abdominal pain: Secondary | ICD-10-CM | POA: Diagnosis present

## 2019-03-24 DIAGNOSIS — R1110 Vomiting, unspecified: Secondary | ICD-10-CM | POA: Insufficient documentation

## 2019-03-24 DIAGNOSIS — R1011 Right upper quadrant pain: Secondary | ICD-10-CM

## 2019-03-24 DIAGNOSIS — K8020 Calculus of gallbladder without cholecystitis without obstruction: Secondary | ICD-10-CM | POA: Diagnosis not present

## 2019-03-24 DIAGNOSIS — F909 Attention-deficit hyperactivity disorder, unspecified type: Secondary | ICD-10-CM | POA: Diagnosis not present

## 2019-03-24 DIAGNOSIS — K8050 Calculus of bile duct without cholangitis or cholecystitis without obstruction: Secondary | ICD-10-CM

## 2019-03-24 LAB — COMPREHENSIVE METABOLIC PANEL
ALT: 49 U/L — ABNORMAL HIGH (ref 0–44)
AST: 28 U/L (ref 15–41)
Albumin: 4.4 g/dL (ref 3.5–5.0)
Alkaline Phosphatase: 60 U/L (ref 38–126)
Anion gap: 9 (ref 5–15)
BUN: 14 mg/dL (ref 6–20)
CO2: 30 mmol/L (ref 22–32)
Calcium: 9.4 mg/dL (ref 8.9–10.3)
Chloride: 101 mmol/L (ref 98–111)
Creatinine, Ser: 0.8 mg/dL (ref 0.44–1.00)
GFR calc Af Amer: >60 mL/min (ref >60)
GFR calc non Af Amer: >60 mL/min (ref >60)
Glucose, Bld: 101 mg/dL — ABNORMAL HIGH (ref 70–99)
Potassium: 3.7 mmol/L (ref 3.5–5.1)
Sodium: 140 mmol/L (ref 135–145)
Total Bilirubin: 0.2 mg/dL — ABNORMAL LOW (ref 0.3–1.2)
Total Protein: 7.4 g/dL (ref 6.5–8.1)

## 2019-03-24 LAB — CBC
HCT: 39.6 % (ref 36.0–46.0)
Hemoglobin: 13.2 g/dL (ref 12.0–15.0)
MCH: 30 pg (ref 26.0–34.0)
MCHC: 33.3 g/dL (ref 30.0–36.0)
MCV: 90 fL (ref 80.0–100.0)
Platelets: 331 10*3/uL (ref 150–400)
RBC: 4.4 MIL/uL (ref 3.87–5.11)
RDW: 11.9 % (ref 11.5–15.5)
WBC: 9.7 10*3/uL (ref 4.0–10.5)
nRBC: 0 % (ref 0.0–0.2)

## 2019-03-24 LAB — URINALYSIS, ROUTINE W REFLEX MICROSCOPIC
Bilirubin Urine: NEGATIVE
Glucose, UA: NEGATIVE mg/dL
Hgb urine dipstick: NEGATIVE
Ketones, ur: NEGATIVE mg/dL
Leukocytes,Ua: NEGATIVE
Nitrite: NEGATIVE
Protein, ur: NEGATIVE mg/dL
Specific Gravity, Urine: 1.016 (ref 1.005–1.030)
pH: 6 (ref 5.0–8.0)

## 2019-03-24 LAB — LIPASE, BLOOD: Lipase: 26 U/L (ref 11–51)

## 2019-03-24 LAB — I-STAT BETA HCG BLOOD, ED (MC, WL, AP ONLY): I-stat hCG, quantitative: <5 m[IU]/mL (ref <5)

## 2019-03-24 MED ORDER — HYDROCODONE-ACETAMINOPHEN 5-325 MG PO TABS: 1.0000 | ORAL_TABLET | Freq: Four times a day (QID) | ORAL | 0 refills | Status: DC | PRN

## 2019-03-24 MED ORDER — HYDROMORPHONE HCL 1 MG/ML IJ SOLN
1.0000 mg | Freq: Once | INTRAMUSCULAR | Status: AC
  Administered 2019-03-24: 1 mg via INTRAVENOUS
  Filled 2019-03-24: qty 1

## 2019-03-24 MED ORDER — SODIUM CHLORIDE 0.9 % IV BOLUS
1000.0000 mL | Freq: Once | INTRAVENOUS | Status: AC
  Administered 2019-03-24: 1000 mL via INTRAVENOUS

## 2019-03-24 MED ORDER — ONDANSETRON HCL 4 MG/2ML IJ SOLN
4.0000 mg | Freq: Once | INTRAMUSCULAR | Status: AC
  Administered 2019-03-24: 4 mg via INTRAVENOUS
  Filled 2019-03-24: qty 2

## 2019-03-24 MED ORDER — SODIUM CHLORIDE 0.9% FLUSH
3.0000 mL | Freq: Once | INTRAVENOUS | Status: AC
  Administered 2019-03-24: 10:00:00 3 mL via INTRAVENOUS

## 2019-03-24 MED ORDER — ONDANSETRON HCL 4 MG PO TABS: 4.0000 mg | ORAL_TABLET | Freq: Four times a day (QID) | ORAL | 0 refills | Status: DC

## 2019-03-24 NOTE — ED Notes (Signed)
Pt assisted into personal wheelchair to transfer to the RR. Urine sample cup provided.

## 2019-03-24 NOTE — ED Triage Notes (Signed)
Pt reports having gallstones and surgery to remove gallbladder on 12/23, but pains are worse this morning with dry heaves.

## 2019-03-24 NOTE — ED Provider Notes (Signed)
[facility]-EMERGENCY DEPT Provider Note   CSN: [id number] Arrival date & time: 03/24/19  4098     History Chief Complaint  Patient presents with  . Abdominal Pain  . Emesis    Maria Bond is a 28 y.o. adult.  HPI Patient presents to the emergency department with nausea vomiting and abdominal pain that started 2 days ago.  The patient states that she is due to have gallbladder surgery coming up in the next week.  But she was having a lot of pain.  Patient states that she has had vomiting as well.  Patient states that nothing seems to make condition better or worse.  The patient denies chest pain, shortness of breath, headache,blurred vision, neck pain, fever, cough, weakness, numbness, dizziness, anorexia, edema,  diarrhea, rash, back pain, dysuria, hematemesis, bloody stool, near syncope, or syncope.    Past Medical History:  Diagnosis Date  . ADHD (attention deficit hyperactivity disorder)   . Allergy   . Anemia   . Anxiety   . Autistic disorder   . Depression   . POTS (postural orthostatic tachycardia syndrome)   . Tachycardia     Patient Active Problem List   Diagnosis Date Noted  . Tachycardia 11/05/2018  . Hypotension 11/05/2018  . Female-to-female transgender person 01/12/2018  . Radicular pain in right arm 12/05/2017  . Right cervical radiculopathy 12/05/2017  . Need for prophylactic vaccination and inoculation against influenza 12/05/2017  . Depression 04/27/2014  . Asperger's disorder 12/06/2011  . Anxiety 12/06/2011    Past Surgical History:  Procedure Laterality Date  . VAGINOPLASTY  08/26/2015     OB History   No obstetric history on file.     Family History  Problem Relation Age of Onset  . Healthy Mother   . Celiac disease Mother   . Irritable bowel syndrome Mother   . Arthritis Father 48       degenerative disc disease  . Heart disease Father   . Stroke Paternal Grandmother   . Heart disease Paternal Grandfather    . Colon polyps Maternal Aunt   . Irritable bowel syndrome Maternal Aunt   . Colon cancer Neg Hx   . Esophageal cancer Neg Hx   . Rectal cancer Neg Hx   . Stomach cancer Neg Hx     Social History   Tobacco Use  . Smoking status: Never Smoker  . Smokeless tobacco: Never Used  Substance Use Topics  . Alcohol use: No  . Drug use: No    Home Medications Prior to Admission medications   Medication Sig Start Date End Date Taking? Authorizing Provider  albuterol (VENTOLIN HFA) 108 (90 Base) MCG/ACT inhaler Inhale 2 puffs into the lungs 2 (two) times daily as needed. 07/27/18   [provider]  busPIRone (BUSPAR) 15 MG tablet Take 15 mg by mouth 2 (two) times daily.    [provider]  clonazePAM (KLONOPIN) 1 MG disintegrating tablet Take 1 mg by mouth daily. 10/07/18   [provider]  cyclobenzaprine (FLEXERIL) 10 MG tablet Take 10 mg by mouth 3 (three) times daily as needed for muscle spasms.    [provider]  desvenlafaxine (PRISTIQ) 100 MG 24 hr tablet Take 100 mg by mouth daily. 02/01/19   [provider]  estradiol (ESTRACE) 2 MG tablet Take 1.5 tablets (3 mg total) by mouth daily. 11/12/17   Sherren Mocha, MD  gabapentin (NEURONTIN) 100 MG capsule Take 300-500 mg by mouth daily as  needed. 10/06/18   [provider]  LORazepam (ATIVAN) 2 MG tablet Take 2 mg by mouth daily as needed for anxiety.    [provider]  Multiple Vitamin (MULTIVITAMIN WITH MINERALS) TABS tablet Take 1 tablet by mouth daily.    [provider]  propranolol (INDERAL) 10 MG tablet Take 1 tablet (10 mg total) by mouth 2 (two) times daily. Please call to schedule appt with Dr Graciela HusbandsKlein for future refills (1st attempt) 06/05/18   Duke SalviaKlein, Steven C, MD  VYVANSE 20 MG capsule Take 40 mg by mouth daily. 10/20/18   [provider]    Allergies    Sulfa antibiotics  Review of Systems   Review of Systems All other systems negative except as  documented in the HPI. All pertinent positives and negatives as reviewed in the HPI. Physical Exam Updated Vital Signs BP (!) 134/99 (BP Location: Left Arm)   Pulse (!) 103   Temp 97.6 F (36.4 C) (Oral)   Resp 18   SpO2 100%   Physical Exam Vitals and nursing note reviewed.  Constitutional:      General: She is not in acute distress.    Appearance: She is well-developed.  HENT:     Head: Normocephalic and atraumatic.  Eyes:     Pupils: Pupils are equal, round, and reactive to light.  Cardiovascular:     Rate and Rhythm: Normal rate and regular rhythm.     Heart sounds: Normal heart sounds. No murmur. No friction rub. No gallop.   Pulmonary:     Effort: Pulmonary effort is normal. No respiratory distress.     Breath sounds: Normal breath sounds. No wheezing.  Abdominal:     General: Bowel sounds are normal. There is no distension.     Palpations: Abdomen is soft.     Tenderness: There is abdominal tenderness. There is no guarding.  Musculoskeletal:     Cervical back: Normal range of motion and neck supple.  Skin:    General: Skin is warm and dry.     Capillary Refill: Capillary refill takes less than 2 seconds.     Findings: No erythema or rash.  Neurological:     Mental Status: She is alert and oriented to person, place, and time.     Motor: No abnormal muscle tone.     Coordination: Coordination normal.  Psychiatric:        Behavior: Behavior normal.     ED Results / Procedures / Treatments   Labs (all labs ordered are listed, but only abnormal results are displayed) Labs Reviewed  CBC  LIPASE, BLOOD  COMPREHENSIVE METABOLIC PANEL  URINALYSIS, ROUTINE W REFLEX MICROSCOPIC  I-STAT BETA HCG BLOOD, ED ([facility], [facility], [facility] ONLY)    EKG None  Radiology No results found.  Procedures Procedures (including critical care time)  Medications Ordered in ED Medications  sodium chloride flush (NS) 0.9 % injection 3 mL (3 mLs Intravenous Given 03/24/19 0959)    ED  Course  I have reviewed the triage vital signs and the nursing notes.  Pertinent labs & imaging results that were available during my care of the patient were reviewed by me and considered in my medical decision making (see chart for details).    MDM Rules/Calculators/A&P                      Patient is feeling better following medications and fluids.  The patient is advised to follow-up with her surgeon for  further evaluation and care.  Patient has been stable and told to return here for any worsening in her condition. Final Clinical Impression(s) / ED Diagnoses Final diagnoses:  None    Rx / DC Orders ED Discharge Orders    None       Dalia Heading, PA-C 03/29/19 1620    Sherwood Gambler, MD 03/30/19 [phone]

## 2019-03-24 NOTE — ED Notes (Signed)
An After Visit Summary was printed and given to the patient. Discharge instructions given and no further questions at this time.  

## 2019-03-24 NOTE — Discharge Instructions (Addendum)
Return here as needed.  Follow-up with your surgeon. °

## 2019-11-12 ENCOUNTER — Encounter (HOSPITAL_COMMUNITY): Payer: Self-pay | Admitting: Emergency Medicine

## 2019-11-12 ENCOUNTER — Ambulatory Visit (HOSPITAL_COMMUNITY)
Admission: EM | Admit: 2019-11-12 | Discharge: 2019-11-12 | Disposition: A | Discharge status: Home / Self Care | Payer: PRIVATE HEALTH INSURANCE

## 2019-11-12 ENCOUNTER — Other Ambulatory Visit: Payer: Self-pay

## 2019-11-12 DIAGNOSIS — R059 Cough, unspecified: Secondary | ICD-10-CM

## 2019-11-12 DIAGNOSIS — J209 Acute bronchitis, unspecified: Secondary | ICD-10-CM

## 2019-11-12 DIAGNOSIS — R05 Cough: Secondary | ICD-10-CM | POA: Diagnosis not present

## 2019-11-12 DIAGNOSIS — R0602 Shortness of breath: Secondary | ICD-10-CM | POA: Diagnosis not present

## 2019-11-12 MED ORDER — PREDNISONE 10 MG (21) PO TBPK
ORAL_TABLET | Freq: Every day | ORAL | 0 refills | Status: AC
Start: 2019-11-12 — End: 2019-11-18

## 2019-11-12 MED ORDER — BENZONATATE 100 MG PO CAPS
100.0000 mg | ORAL_CAPSULE | Freq: Three times a day (TID) | ORAL | 0 refills | Status: DC
Start: 2019-11-12 — End: 2020-02-08

## 2019-11-12 NOTE — ED Triage Notes (Signed)
Patient presents to urgent care today with symptoms of shortness of breath and dry cough. Symptoms began about 3 weeks ago. They have tried OTC expectorants and albuterol  with some relief of symptoms.

## 2019-11-12 NOTE — ED Provider Notes (Signed)
[facility]   [id number] 11/12/19 Arrival Time: 1826   SUBJECTIVE:  Maria Bond is a 29 y.o. adult who presents with complaint of nasal congestion, post-nasal drainage, and a persistent cough. Onset abrupt approximately 3 weeks ago. Overall fatigued. SOB: mild. Wheezing: mild. Has negative history of Covid. Has not completed Covid vaccines. OTC treatment: has been using OTC cough syrup with some relief. Has been having to use her rescue inhaler more often than usual. Denies fever, nausea, vomiting, diarrhea, headache, rash, other symptoms. Social History   Tobacco Use  Smoking Status Never Smoker  Smokeless Tobacco Never Used    ROS: As per HPI.   OBJECTIVE:  Vitals:   11/12/19 1852  BP: 110/77  Pulse: 88  Resp: 18  Temp: 98.4 F (36.9 C)  TempSrc: Oral  SpO2: 100%     General appearance: alert; no distress HEENT: nasal congestion; clear runny nose; throat irritation secondary to post-nasal drainage Neck: supple without LAD Lungs: mild wheezing to bilateral lung bases Skin: warm and dry Psychological: alert and cooperative; normal mood and affect  Results for orders placed or performed during the hospital encounter of 03/24/19  Lipase, blood  Result Value Ref Range   Lipase 26 11 - 51 U/L  Comprehensive metabolic panel  Result Value Ref Range   Sodium 140 135 - 145 mmol/L   Potassium 3.7 3.5 - 5.1 mmol/L   Chloride 101 98 - 111 mmol/L   CO2 30 22 - 32 mmol/L   Glucose, Bld 101 (H) 70 - 99 mg/dL   BUN 14 6 - 20 mg/dL   Creatinine, Ser 9.37 0.44 - 1.00 mg/dL   Calcium 9.4 8.9 - 34.2 mg/dL   Total Protein 7.4 6.5 - 8.1 g/dL   Albumin 4.4 3.5 - 5.0 g/dL   AST 28 15 - 41 U/L   ALT 49 (H) 0 - 44 U/L   Alkaline Phosphatase 60 38 - 126 U/L   Total Bilirubin 0.2 (L) 0.3 - 1.2 mg/dL   GFR calc non Af Amer >60 >60 mL/min   GFR calc Af Amer >60 >60 mL/min   Anion gap 9 5 - 15  CBC  Result Value Ref Range   WBC 9.7 4.0 - 10.5 K/uL   RBC 4.40 3.87 -  5.11 MIL/uL   Hemoglobin 13.2 12.0 - 15.0 g/dL   HCT 87.6 36 - 46 %   MCV 90.0 80.0 - 100.0 fL   MCH 30.0 26.0 - 34.0 pg   MCHC 33.3 30.0 - 36.0 g/dL   RDW 81.1 57.2 - 62.0 %   Platelets 331 150 - 400 K/uL   nRBC 0.0 0.0 - 0.2 %  Urinalysis, Routine w reflex microscopic  Result Value Ref Range   Color, Urine YELLOW YELLOW   APPearance CLEAR CLEAR   Specific Gravity, Urine 1.016 1.005 - 1.030   pH 6.0 5.0 - 8.0   Glucose, UA NEGATIVE NEGATIVE mg/dL   Hgb urine dipstick NEGATIVE NEGATIVE   Bilirubin Urine NEGATIVE NEGATIVE   Ketones, ur NEGATIVE NEGATIVE mg/dL   Protein, ur NEGATIVE NEGATIVE mg/dL   Nitrite NEGATIVE NEGATIVE   Leukocytes,Ua NEGATIVE NEGATIVE  I-Stat beta hCG blood, ED  Result Value Ref Range   I-stat hCG, quantitative <5.0 <5 mIU/mL   Comment 3            Labs Reviewed - No data to display  No results found.  Allergies  Allergen Reactions  . Sulfa Antibiotics Hives    Past  Medical History:  Diagnosis Date  . ADHD (attention deficit hyperactivity disorder)   . Allergy   . Anemia   . Anxiety   . Autistic disorder   . Depression   . POTS (postural orthostatic tachycardia syndrome)   . Tachycardia    Social History   Socioeconomic History  . Marital status: Single    Spouse name: Not on file  . Number of children: Not on file  . Years of education: Not on file  . Highest education level: Not on file  Occupational History  . Not on file  Tobacco Use  . Smoking status: Never Smoker  . Smokeless tobacco: Never Used  Vaping Use  . Vaping Use: Never used  Substance and Sexual Activity  . Alcohol use: No  . Drug use: No  . Sexual activity: Not on file  Other Topics Concern  . Not on file  Social History Narrative  . Not on file   Social Determinants of Health   Financial Resource Strain:   . Difficulty of Paying Living Expenses:   Food Insecurity:   . Worried About Programme researcher, broadcasting/film/video in the Last Year:   . Barista in the Last  Year:   Transportation Needs:   . Freight forwarder (Medical):   Marland Kitchen Lack of Transportation (Non-Medical):   Physical Activity:   . Days of Exercise per Week:   . Minutes of Exercise per Session:   Stress:   . Feeling of Stress :   Social Connections:   . Frequency of Communication with Friends and Family:   . Frequency of Social Gatherings with Friends and Family:   . Attends Religious Services:   . Active Member of Clubs or Organizations:   . Attends Banker Meetings:   Marland Kitchen Marital Status:   Intimate Partner Violence:   . Fear of Current or Ex-Partner:   . Emotionally Abused:   Marland Kitchen Physically Abused:   . Sexually Abused:    Family History  Problem Relation Age of Onset  . Healthy Mother   . Celiac disease Mother   . Irritable bowel syndrome Mother   . Arthritis Father 25       degenerative disc disease  . Heart disease Father   . Stroke Paternal Grandmother   . Heart disease Paternal Grandfather   . Colon polyps Maternal Aunt   . Irritable bowel syndrome Maternal Aunt   . Colon cancer Neg Hx   . Esophageal cancer Neg Hx   . Rectal cancer Neg Hx   . Stomach cancer Neg Hx      ASSESSMENT & PLAN:  1. Acute bronchitis, unspecified organism   2. Cough   3. Shortness of breath     Meds ordered this encounter  Medications  . predniSONE (STERAPRED UNI-PAK 21 TAB) 10 MG (21) TBPK tablet    Sig: Take by mouth daily for 6 days. Take 6 tablets on day 1, 5 tablets on day 2, 4 tablets on day 3, 3 tablets on day 4, 2 tablets on day 5, 1 tablet on day 6    Dispense:  21 tablet    Refill:  0    Order Specific Question:   Supervising Provider    Answer:   Merrilee Jansky [id number]  . benzonatate (TESSALON) 100 MG capsule    Sig: Take 1 capsule (100 mg total) by mouth every 8 (eight) hours.    Dispense:  21 capsule    Refill:  0    Order Specific Question:   Supervising Provider    Answer:   Merrilee Jansky [[id number]]   Acute Bronchitis Prescribed  tessalon perles Prescribed prednisone taper Will treat with above therapy given duration of symptoms.   OTC symptom care as needed. Will plan f/u with PCP or here as needed.  Reviewed expectations re: course of current medical issues. Questions answered. Outlined signs and symptoms indicating need for more acute intervention. Patient verbalized understanding. After Visit Summary given.           Moshe Cipro, NP 11/12/19 2010

## 2019-11-12 NOTE — Discharge Instructions (Signed)
I have sent in tessalon perles for you to help with your cough  I have sent in a prednisone taper for you to take for 6 days. 6 tablets on day one, 5 tablets on day two, 4 tablets on day three, 3 tablets on day four, 2 tablets on day five, and 1 tablet on day six.  Follow up with this office or with primary care if you are not improving over the next few days  Follow up with the ER for high fever, trouble swallowing, trouble breathing, other concerning symptoms

## 2020-02-08 ENCOUNTER — Other Ambulatory Visit: Payer: Self-pay

## 2020-02-08 ENCOUNTER — Ambulatory Visit (HOSPITAL_COMMUNITY)
Admission: EM | Admit: 2020-02-08 | Discharge: 2020-02-08 | Disposition: A | Discharge status: Home / Self Care | Payer: PRIVATE HEALTH INSURANCE | Attending: Family Medicine | Admitting: Family Medicine

## 2020-02-08 ENCOUNTER — Encounter (HOSPITAL_COMMUNITY): Payer: Self-pay

## 2020-02-08 DIAGNOSIS — R051 Acute cough: Secondary | ICD-10-CM | POA: Diagnosis not present

## 2020-02-08 DIAGNOSIS — R059 Cough, unspecified: Secondary | ICD-10-CM | POA: Diagnosis not present

## 2020-02-08 DIAGNOSIS — J029 Acute pharyngitis, unspecified: Secondary | ICD-10-CM | POA: Insufficient documentation

## 2020-02-08 DIAGNOSIS — Z20822 Contact with and (suspected) exposure to covid-19: Secondary | ICD-10-CM | POA: Insufficient documentation

## 2020-02-08 DIAGNOSIS — Z882 Allergy status to sulfonamides status: Secondary | ICD-10-CM | POA: Insufficient documentation

## 2020-02-08 DIAGNOSIS — R062 Wheezing: Secondary | ICD-10-CM | POA: Diagnosis not present

## 2020-02-08 MED ORDER — BENZONATATE 100 MG PO CAPS
ORAL_CAPSULE | ORAL | 0 refills | Status: DC
Start: 2020-02-08 — End: 2020-05-17

## 2020-02-08 MED ORDER — PREDNISONE 10 MG (21) PO TBPK
ORAL_TABLET | Freq: Every day | ORAL | 0 refills | Status: DC
Start: 2020-02-08 — End: 2020-05-17

## 2020-02-08 NOTE — Discharge Instructions (Signed)
You have been tested for COVID-19 today. °If your test returns positive, you will receive a phone call from [place] regarding your results. °Negative test results are not called. °Both positive and negative results area always visible on MyChart. °If you do not have a MyChart account, sign up instructions are provided in your discharge papers. °Please do not hesitate to contact us should you have questions or concerns. ° °

## 2020-02-08 NOTE — ED Triage Notes (Signed)
Pt c/o sore throat onset Saturday and then non-productive cough later that same day. Also reports feeling SOB and having to take albuterol three times/day w/o improvement to symptoms, low-grade fever of 99.2., runny nose with clear secretions, congestion.  States throat feels "better after taking albuterol", otherwise sore throat has improved.  Pt states she had to miss her intense psychotherapy where she normally receives spravato.   Denies n/v/d, body aches, loss of taste/smell. Has been taking guaifenesin, Aleve, dextromethorphan, benadryl.  Lungs CTA bilaterally, speaking full sentences w/o difficulty. Oropharynx with small amount erythema.

## 2020-02-09 LAB — SARS CORONAVIRUS 2 (TAT 6-24 HRS): SARS Coronavirus 2: NEGATIVE

## 2020-02-09 NOTE — ED Provider Notes (Signed)
[facility]   [id number] 02/08/20 Arrival Time: 1732  ASSESSMENT & PLAN:  1. Cough   2. Wheezing      COVID-19 testing sent. See letter/work note on file for self-isolation guidelines.  Begin: Meds ordered this encounter  Medications  . predniSONE (STERAPRED UNI-PAK 21 TAB) 10 MG (21) TBPK tablet    Sig: Take by mouth daily. Take as directed.    Dispense:  21 tablet    Refill:  0  . benzonatate (TESSALON) 100 MG capsule    Sig: Take 1 capsule by mouth every 8 (eight) hours for cough.    Dispense:  21 capsule    Refill:  0     Follow-up Information    [facility].   Specialty: Urgent Care Why: If worsening or failing to improve as anticipated. Contact information: [address] [place] [address] [phone]              Reviewed expectations re: course of current medical issues. Questions answered. Outlined signs and symptoms indicating need for more acute intervention. Understanding verbalized. After Visit Summary given.   SUBJECTIVE: History from: patient. Maria Bond is a 29 y.o. adult who reports sore throat, coughing for the past several days. Low grade temp 99.2. Using inhaler more frequently; questions wheezing. No SOB. Normal PO intake without n/v/d.    OBJECTIVE:  Vitals:   02/08/20 1906  BP: 112/70  Pulse: 86  Resp: 16  Temp: 98 F (36.7 C)  TempSrc: Oral  SpO2: 100%    General appearance: alert; no distress Eyes: PERRLA; EOMI; conjunctiva normal HENT: [place]; AT; with nasal congestion Neck: supple  Lungs: speaks full sentences without difficulty; unlabored Extremities: no edema Skin: warm and dry Neurologic: normal gait Psychological: alert and cooperative; normal mood and affect  Labs: Labs Reviewed  SARS CORONAVIRUS 2 (TAT 6-24 HRS)     Allergies  Allergen Reactions  . Sulfa Antibiotics Hives    Past Medical History:  Diagnosis Date  . ADHD (attention deficit  hyperactivity disorder)   . Allergy   . Anemia   . Anxiety   . Autistic disorder   . Depression   . POTS (postural orthostatic tachycardia syndrome)   . Tachycardia    Social History   Socioeconomic History  . Marital status: Single    Spouse name: Not on file  . Number of children: Not on file  . Years of education: Not on file  . Highest education level: Not on file  Occupational History  . Not on file  Tobacco Use  . Smoking status: Never Smoker  . Smokeless tobacco: Never Used  Vaping Use  . Vaping Use: Never used  Substance and Sexual Activity  . Alcohol use: No  . Drug use: No  . Sexual activity: Not on file  Other Topics Concern  . Not on file  Social History Narrative  . Not on file   Social Determinants of Health   Financial Resource Strain:   . Difficulty of Paying Living Expenses: Not on file  Food Insecurity:   . Worried About Programme researcher, broadcasting/film/video in the Last Year: Not on file  . Ran Out of Food in the Last Year: Not on file  Transportation Needs:   . Lack of Transportation (Medical): Not on file  . Lack of Transportation (Non-Medical): Not on file  Physical Activity:   . Days of Exercise per Week: Not on file  . Minutes of  Exercise per Session: Not on file  Stress:   . Feeling of Stress : Not on file  Social Connections:   . Frequency of Communication with Friends and Family: Not on file  . Frequency of Social Gatherings with Friends and Family: Not on file  . Attends Religious Services: Not on file  . Active Member of Clubs or Organizations: Not on file  . Attends Banker Meetings: Not on file  . Marital Status: Not on file  Intimate Partner Violence:   . Fear of Current or Ex-Partner: Not on file  . Emotionally Abused: Not on file  . Physically Abused: Not on file  . Sexually Abused: Not on file   Family History  Problem Relation Age of Onset  . Healthy Mother   . Celiac disease Mother   . Irritable bowel syndrome Mother   .  Arthritis Father 63       degenerative disc disease  . Heart disease Father   . Stroke Paternal Grandmother   . Heart disease Paternal Grandfather   . Colon polyps Maternal Aunt   . Irritable bowel syndrome Maternal Aunt   . Colon cancer Neg Hx   . Esophageal cancer Neg Hx   . Rectal cancer Neg Hx   . Stomach cancer Neg Hx    Past Surgical History:  Procedure Laterality Date  . VAGINOPLASTY  08/26/2015     Mardella Layman, MD 02/09/20 1114

## 2020-03-07 IMAGING — CT CT ABD-PELV W/ CM
2 of 4 series · 16 of 46 positions shown, 18 images · IV contrast (omnipaque)
Comparison: None.

CLINICAL DATA: Right lower quadrant pain.

EXAM:
CT ABDOMEN AND PELVIS WITH CONTRAST
TECHNIQUE: Multidetector CT imaging of the abdomen and pelvis was performed
using the standard protocol following bolus administration of
intravenous contrast.
CONTRAST:  100mL OMNIPAQUE IOHEXOL 300 MG/ML  SOLN

[Series 3: abd/ pelvis 5.0 i30f 2 · axial · 0.74mm/px · z∈[+697,+1087]mm · 13 of 86 slices shown, 15 images]
[im 4/86  soft-tissue]
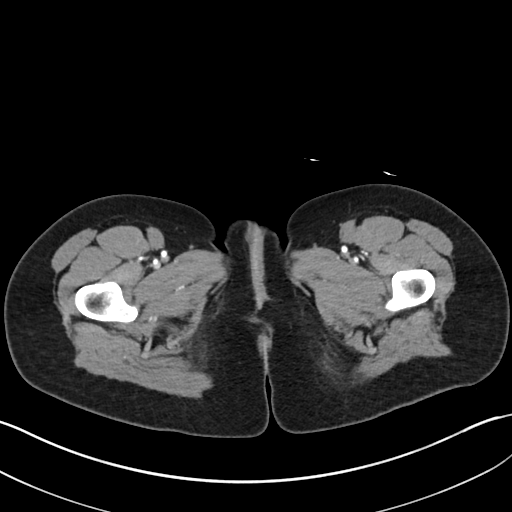
[im 4/86  bone]
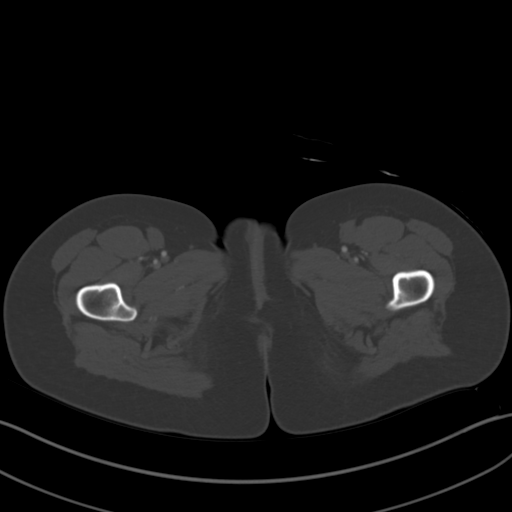
[im 11/86  soft-tissue]
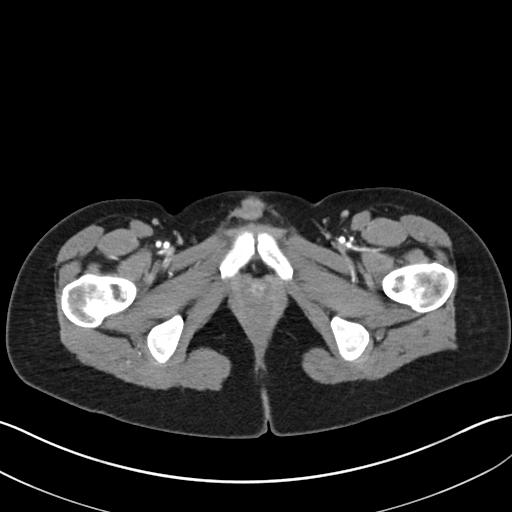
[im 18/86  soft-tissue]
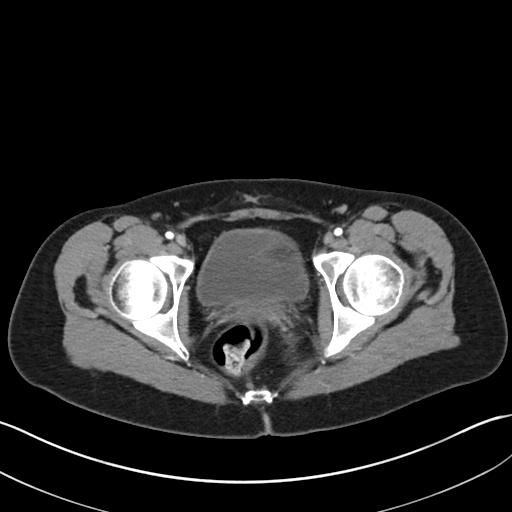
[im 24/86  soft-tissue]
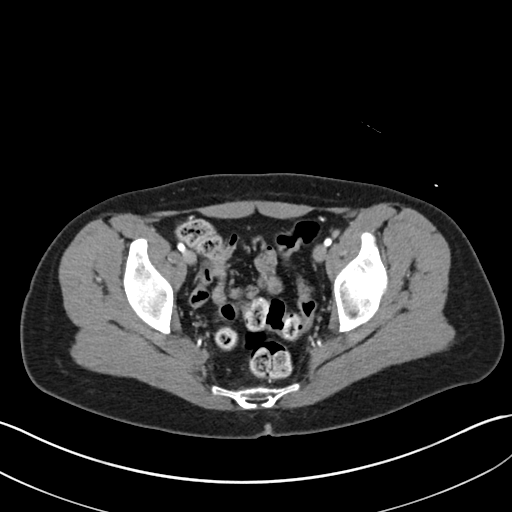
[im 31/86  soft-tissue]
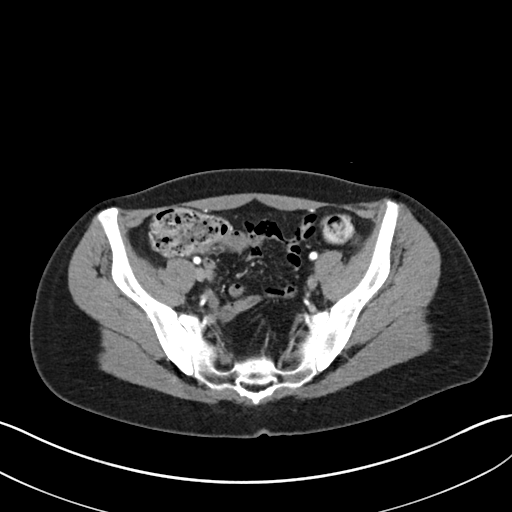
[im 38/86  soft-tissue]
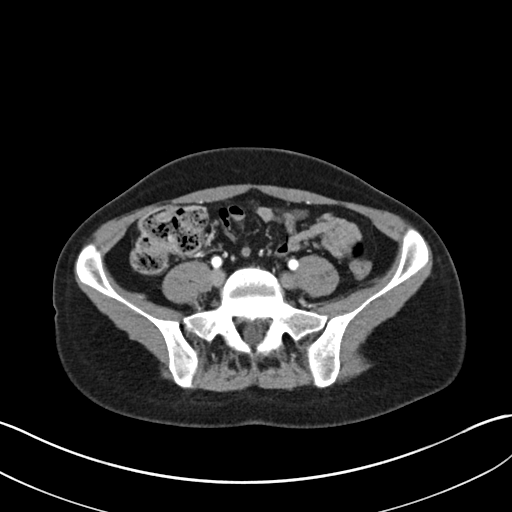
[im 45/86  soft-tissue]
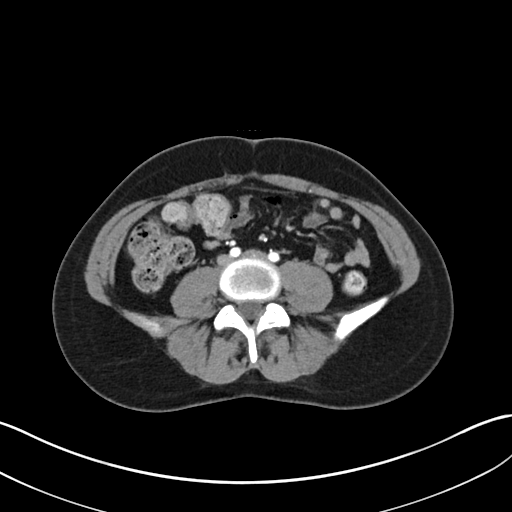
[im 48/86  soft-tissue]
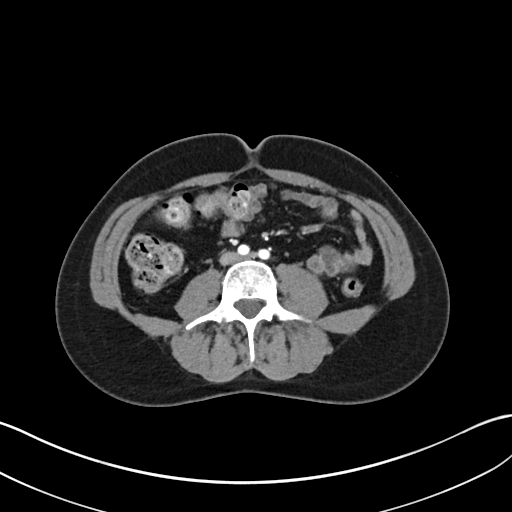
[im 55/86  soft-tissue]
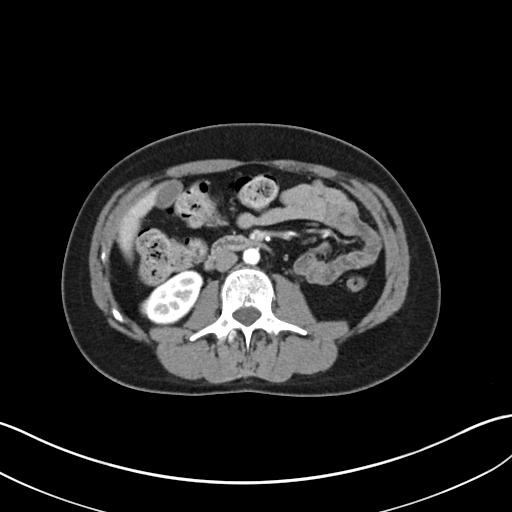
[im 55/86  bone]
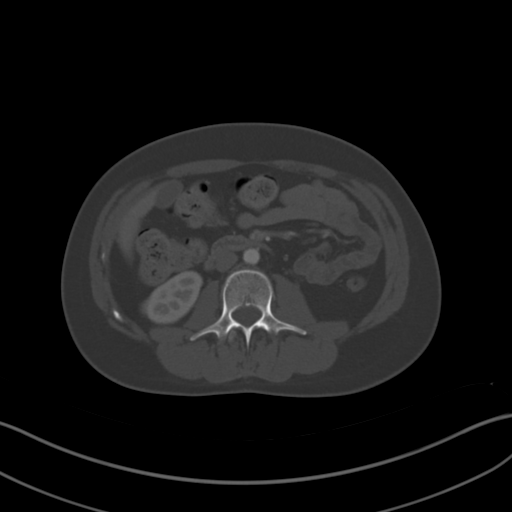
[im 62/86  soft-tissue]
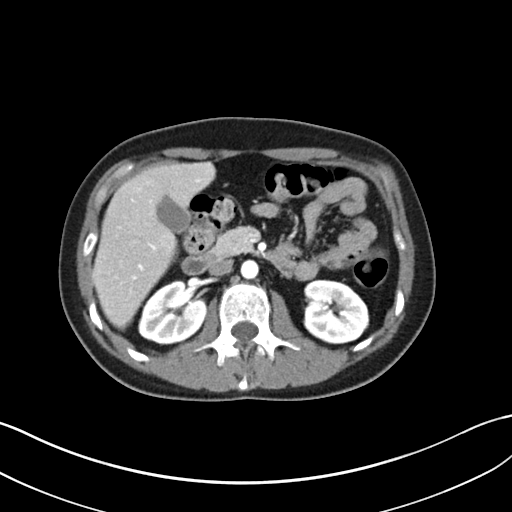
[im 69/86  soft-tissue]
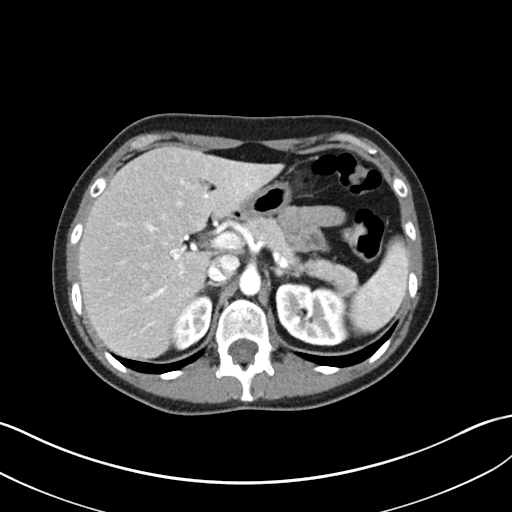
[im 75/86  soft-tissue]
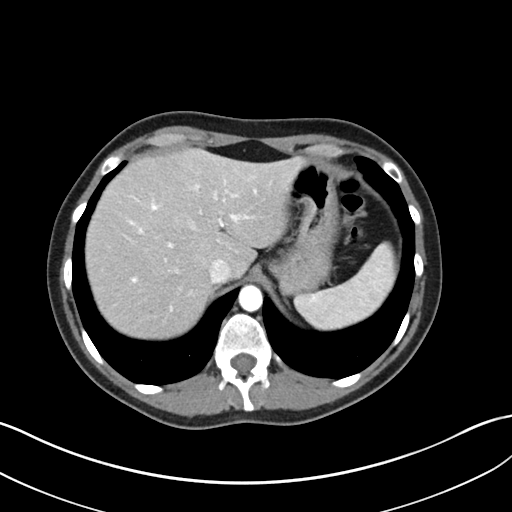
[im 82/86  soft-tissue]
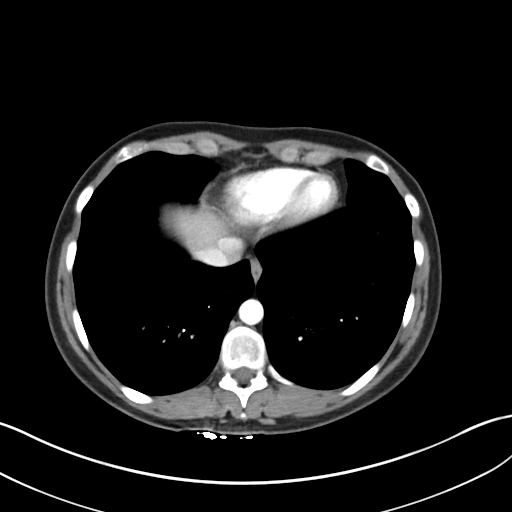

[Series 6: coronal soft tissue · coronal · 0.70mm/px · 3 of 90 slices shown]
[im 30/90  soft-tissue]
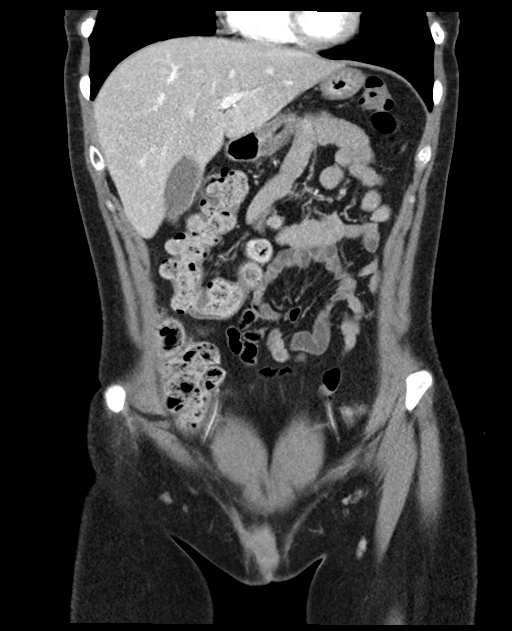
[im 40/90  soft-tissue]
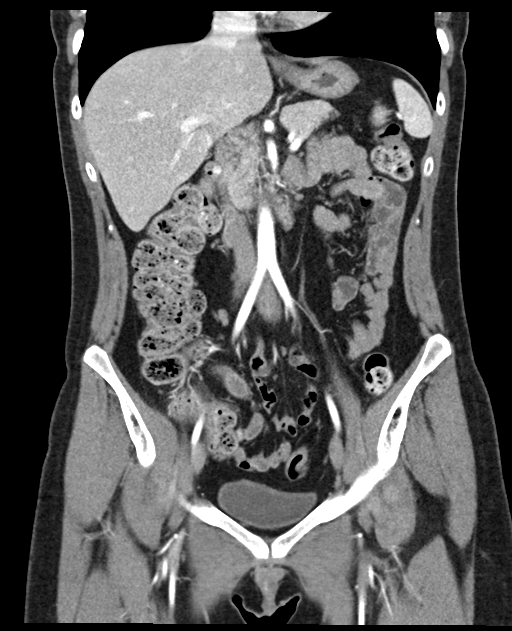
[im 50/90  soft-tissue]
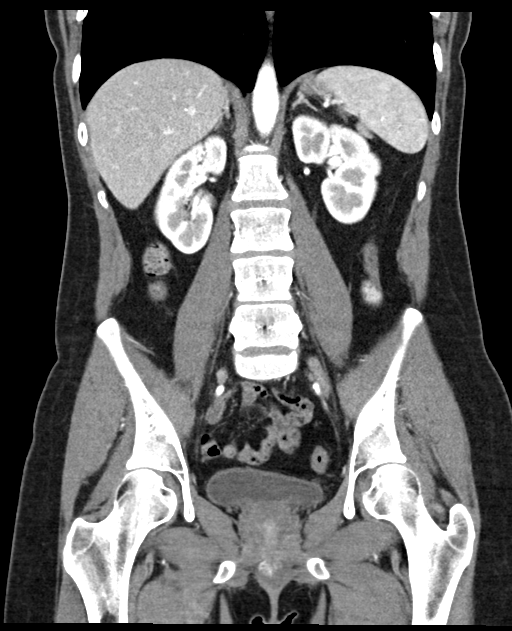

[16 of 46 positions shown; findings below may reference images not displayed]

FINDINGS: Lower chest: Lung bases are clear.

Hepatobiliary: No focal liver abnormality is seen. No gallstones,
gallbladder wall thickening, or biliary dilatation.

Pancreas: No ductal dilatation or inflammation.

Spleen: Normal in size without focal abnormality.

Adrenals/Urinary Tract: Normal adrenal glands. No hydronephrosis or
perinephric edema. Homogeneous renal enhancement. Urinary bladder is
physiologically distended without wall thickening.

Stomach/Bowel: Stomach is within normal limits. Appendix appears
normal, medial to the cecum for example series 3, image 61. No
appendicitis. No terminal ileal inflammation. No evidence of bowel
wall thickening, distention, or inflammatory changes. Moderate stool
in the proximal colon. Small-moderate volume of stool distally.

Vascular/Lymphatic: No significant vascular findings are present. No
enlarged abdominal or pelvic lymph nodes.

Reproductive: No acute findings. Transgender patient.

Other: No free air, free fluid, or intra-abdominal fluid collection.
Tiny fat containing umbilical hernia.

Musculoskeletal: There are no acute or suspicious osseous
abnormalities.
IMPRESSION: No acute abnormality or explanation for abdominal pain. Normal
appendix.

## 2020-04-12 IMAGING — US US ABDOMEN LIMITED
1 series · 14 of 25 positions shown · non-contrast
Comparison: None.

CLINICAL DATA: Upper abdominal pain

EXAM:
ULTRASOUND ABDOMEN LIMITED RIGHT UPPER QUADRANT

[Series 1: us abdomen limited · 14 of 57 slices shown]
[im 1/57]
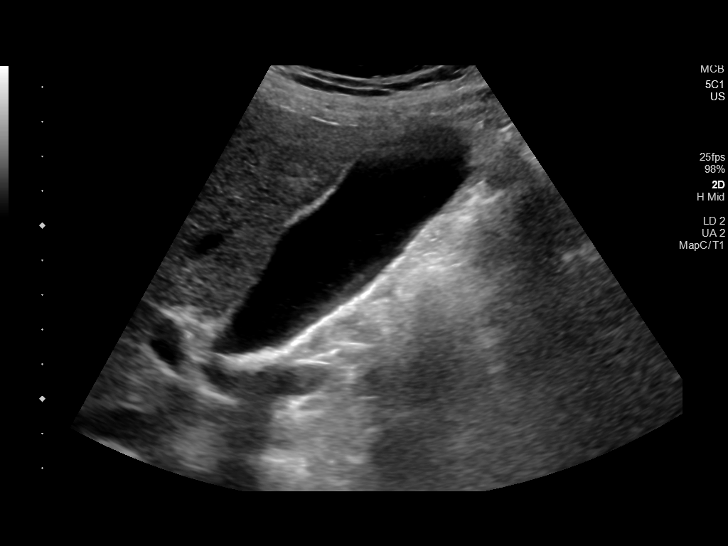
[im 5/57]
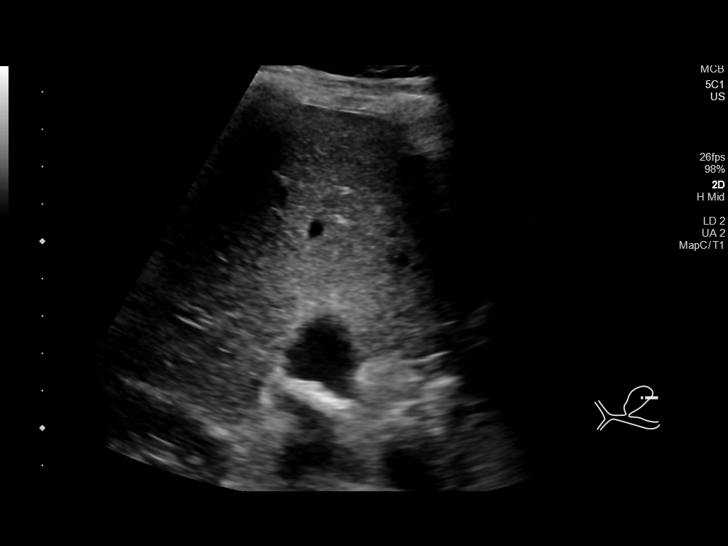
[im 10/57]
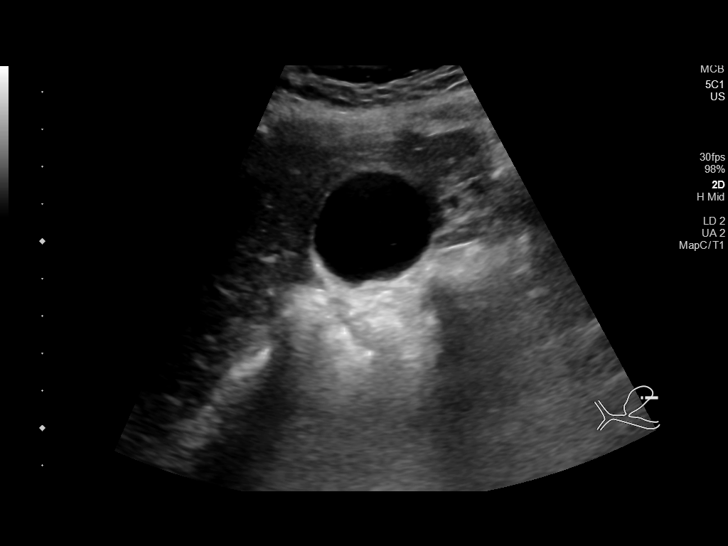
[im 15/57]
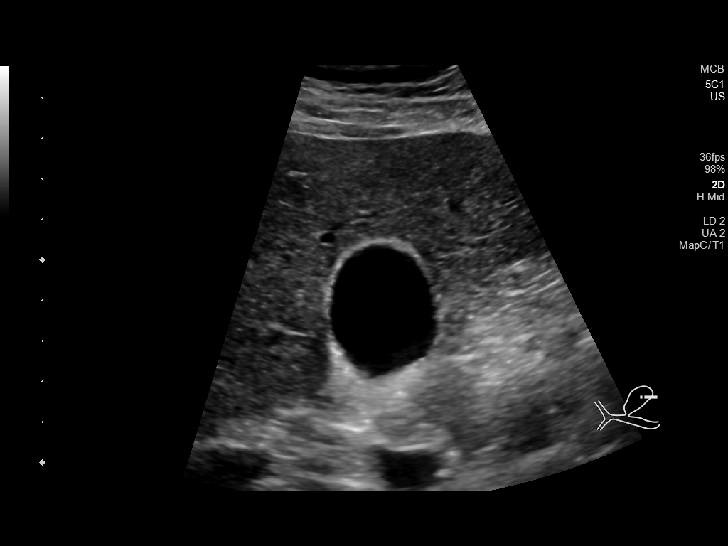
[im 19/57]
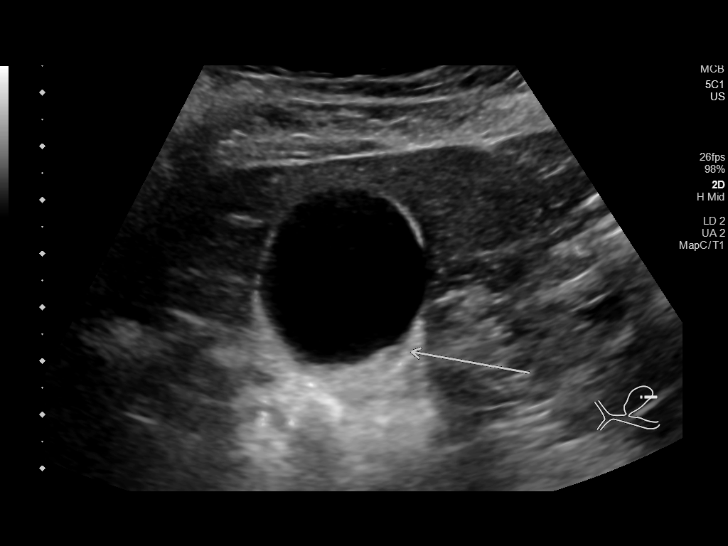
[im 22/57]
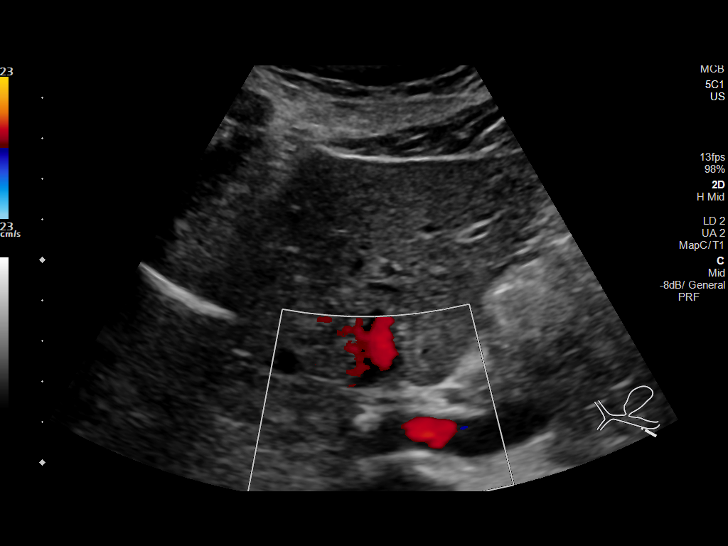
[im 26/57]
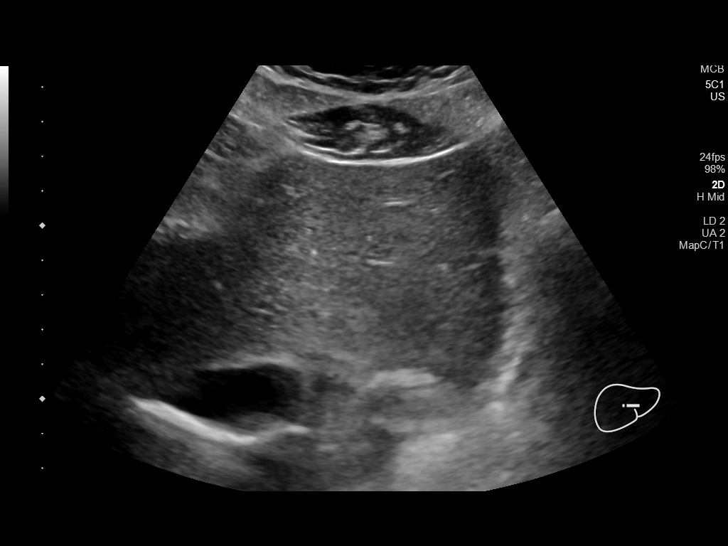
[im 31/57]
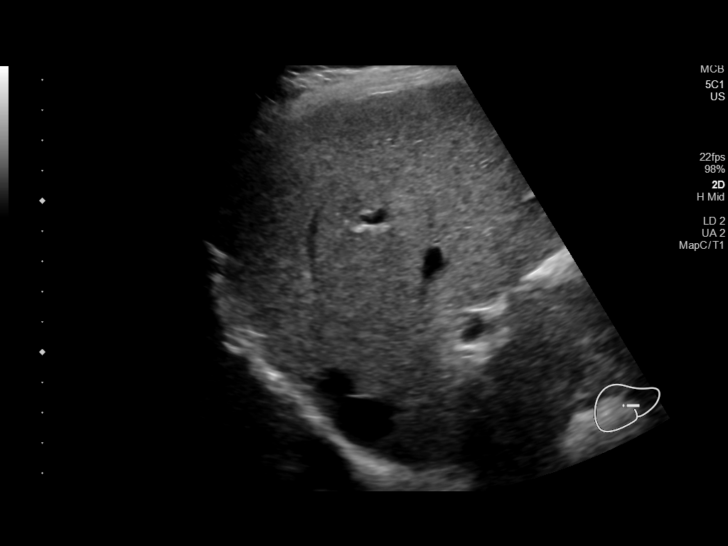
[im 36/57]
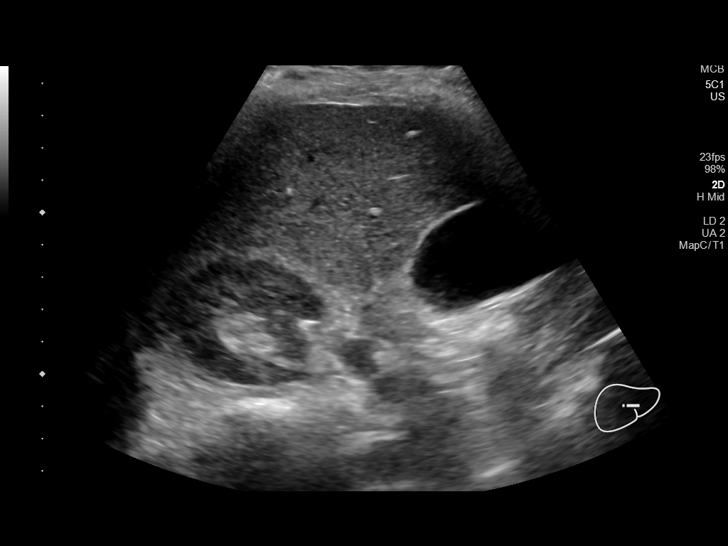
[im 38/57]
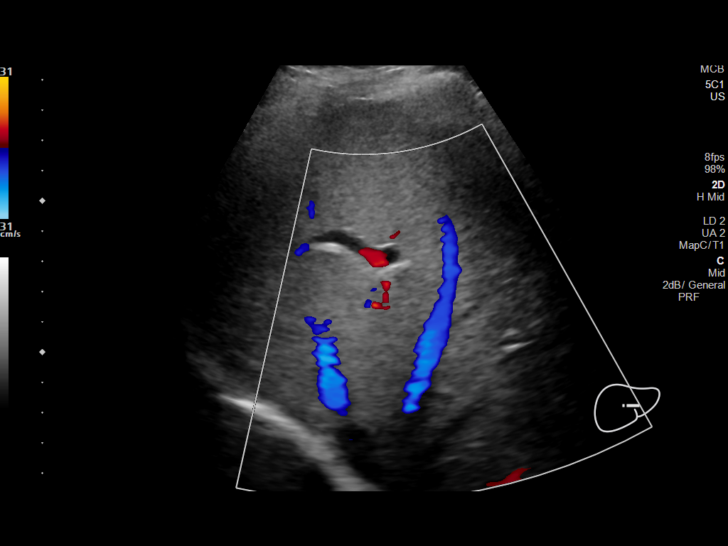
[im 43/57]
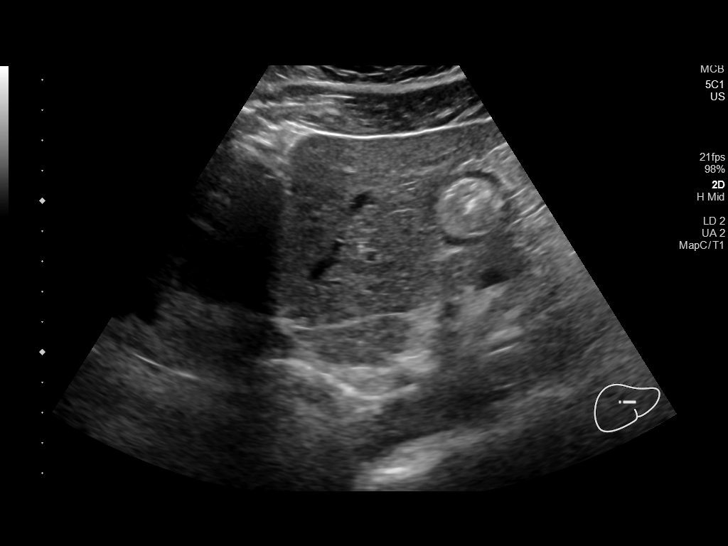
[im 47/57]
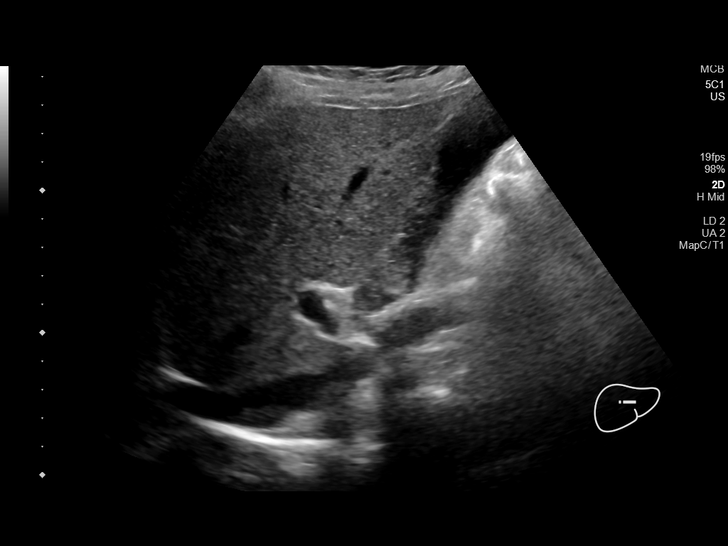
[im 52/57]
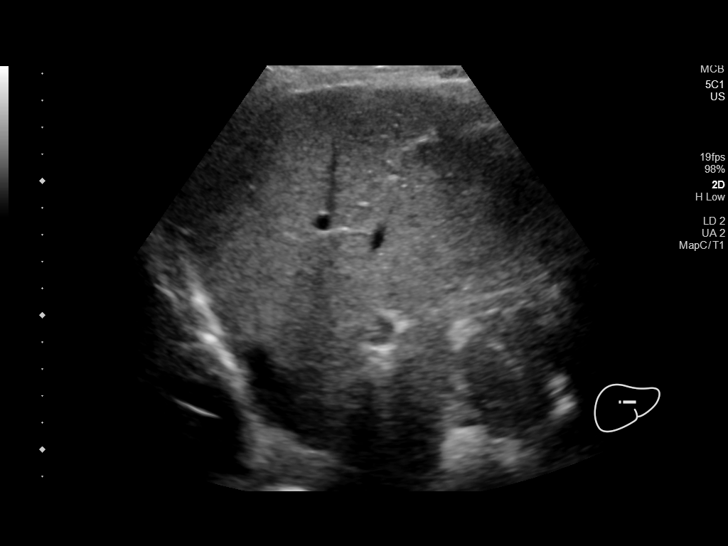
[im 57/57]
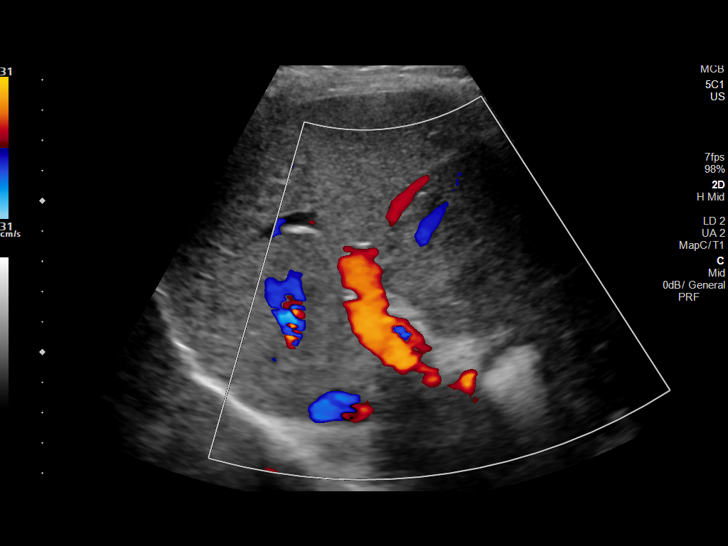

[14 of 25 positions shown; findings below may reference images not displayed]

FINDINGS: Gallbladder:

Within the gallbladder, there are small echogenic foci which move
and shadow consistent with cholelithiasis. Largest gallstone
measures 3 mm in length. No gallbladder wall thickening or
pericholecystic fluid. No sonographic Murphy sign noted by
sonographer.

Common bile duct:

Diameter: 3 mm. No intrahepatic or extrahepatic biliary duct
dilatation.

Liver:

No focal lesion identified. Within normal limits in parenchymal
echogenicity. Portal vein is patent on color Doppler imaging with
normal direction of blood flow towards the liver.

Other: None.
IMPRESSION: Cholelithiasis. No gallbladder wall thickening or pericholecystic
fluid.

Study otherwise unremarkable.

## 2020-05-03 NOTE — Progress Notes (Signed)
Office Visit Note  Patient: Maria Bond             Date of Birth: 16-Apr-1990           MRN: [id number]             PCP: Pcp, No Referring: Ancil Boozer, MD Visit Date: 05/17/2020 Occupation: @GUAROCC @  Subjective:  Raynaud's phenominon.   History of Present Illness: Maria Bond is a 30 y.o. adult seen in consultation per request of her PCP.  According the patient she developed postural tachycardia about 6 years ago.  It is to the point that she has to be in a wheelchair now.  She states about 3 years ago she started having neck and shoulder pain.  At the time she had cervical spine x-rays.  We reviewed the x-rays and the system which were read as within normal limits.  She states she went through physical therapy and was also given a prednisone taper.  While she was on prednisone taper she realized that her tachycardia and fatigue improved.  She also had brain fog which cleared up.  She had symptoms of Raynaud's phenomenon which improved.  She was evaluated by endocrinology and the work-up was negative.  She states that her renal symptoms a started about 6 years ago.  She describes that her hands, feet and her doses take cold during the wintertime.  She has noticed reddish discoloration mostly had occasional purplish discoloration but she has not noticed fingers turning white.  She denies any history of nasal ulcers, sicca symptoms.  She has dry eyes related to the medications.  There is no history of malar rash, photosensitivity, lymphadenopathy or joint swelling.  There is no family history of autoimmune disease.  Activities of Daily Living:  Patient reports morning stiffness for several hours.   Patient Reports nocturnal pain.  Difficulty dressing/grooming: Denies Difficulty climbing stairs: Reports Difficulty getting out of chair: Denies Difficulty using hands for taps, buttons, cutlery, and/or writing: Denies  Review of Systems  Constitutional: Positive for fatigue.  Negative for night sweats, weight gain and weight loss.  HENT: Positive for mouth sores. Negative for trouble swallowing, trouble swallowing, mouth dryness and nose dryness.   Eyes: Positive for itching and dryness. Negative for pain, redness and visual disturbance.  Respiratory: Negative for cough, shortness of breath and difficulty breathing.   Cardiovascular: Positive for palpitations. Negative for chest pain, hypertension, irregular heartbeat and swelling in legs/feet.  Gastrointestinal: Positive for constipation. Negative for blood in stool and diarrhea.  Endocrine: Negative for increased urination.  Genitourinary: Negative for difficulty urinating and vaginal dryness.  Musculoskeletal: Positive for myalgias, morning stiffness and myalgias. Negative for arthralgias, joint pain, joint swelling, muscle weakness and muscle tenderness.  Skin: Positive for color change and rash. Negative for hair loss, redness, skin tightness, ulcers and sensitivity to sunlight.  Allergic/Immunologic: Positive for susceptible to infections.  Neurological: Positive for headaches. Negative for dizziness, numbness, memory loss, night sweats and weakness.  Hematological: Negative for bruising/bleeding tendency and swollen glands.  Psychiatric/Behavioral: Positive for depressed mood and sleep disturbance. The patient is nervous/anxious.     PMFS History:  Patient Active Problem List   Diagnosis Date Noted  . Tachycardia 11/05/2018  . Hypotension 11/05/2018  . Female-to-female transgender person 01/12/2018  . Radicular pain in right arm 12/05/2017  . Right cervical radiculopathy 12/05/2017  . Need for prophylactic vaccination and inoculation against influenza 12/05/2017  . Depression 04/27/2014  . Asperger's disorder 12/06/2011  .  Anxiety 12/06/2011    Past Medical History:  Diagnosis Date  . ADHD (attention deficit hyperactivity disorder)   . Allergy   . Anemia   . Anxiety   . Asthma   . Autistic  disorder   . Depression   . POTS (postural orthostatic tachycardia syndrome)   . Tachycardia     Family History  Problem Relation Age of Onset  . Healthy Mother   . Celiac disease Mother   . Irritable bowel syndrome Mother   . Arthritis Father 4       degenerative disc disease  . Heart disease Father   . Healthy Brother   . Stroke Paternal Grandmother   . Heart disease Paternal Grandfather   . Colon polyps Maternal Aunt   . Irritable bowel syndrome Maternal Aunt   . Colon cancer Neg Hx   . Esophageal cancer Neg Hx   . Rectal cancer Neg Hx   . Stomach cancer Neg Hx    Past Surgical History:  Procedure Laterality Date  . CHOLECYSTECTOMY  2020  . TONSILLECTOMY     as a child   . VAGINOPLASTY  08/26/2015   Social History   Social History Narrative  . Not on file   Immunization History  Administered Date(s) Administered  . Influenza,inj,Quad PF,6+ Mos 12/05/2017  . Influenza-Unspecified 02/16/2014  . PFIZER(Purple Top)SARS-COV-2 Vaccination 07/02/2019, 07/23/2019, 04/10/2020     Objective: Vital Signs: BP 116/79 (BP Location: Left Arm, Patient Position: Sitting, Cuff Size: Normal)   Pulse (!) 101   Resp 13   Ht 5\' 4"  (1.626 m) Comment: per patient  Wt 120 lb (54.4 kg) Comment: per patient  BMI 20.60 kg/m    Physical Exam Vitals and nursing note reviewed.  Constitutional:      Appearance: She is well-developed and well-nourished.  HENT:     Head: Normocephalic and atraumatic.  Eyes:     Extraocular Movements: EOM normal.     Conjunctiva/sclera: Conjunctivae normal.     Pupils: Pupils are equal, round, and reactive to light.  Cardiovascular:     Rate and Rhythm: Normal rate and regular rhythm.     Heart sounds: Normal heart sounds.  Pulmonary:     Effort: Pulmonary effort is normal.     Breath sounds: Normal breath sounds.  Abdominal:     General: Bowel sounds are normal.     Palpations: Abdomen is soft.  Musculoskeletal:     Cervical back: Normal  range of motion and neck supple.  Skin:    General: Skin is warm and dry.     Capillary Refill: Capillary refill takes less than 2 seconds.     Comments: No nailbed capillary changes were noted.  No sclerodactyly was noted.  No telangiectasias were noted.  Neurological:     Mental Status: She is alert and oriented to person, place, and time.  Psychiatric:        Mood and Affect: Mood and affect normal.        Behavior: Behavior normal.      Musculoskeletal Exam: C-spine was in good range of motion.  Shoulder joints, elbow joints, wrist joints, MCPs PIPs and DIPs with good range of motion with no synovitis.  She has some hypermobility in her elbows and wrist joints.  Hip joints and knee joints with good range of motion.  Ankles MTPs PIPs with good range of motion with no synovitis.  CDAI Exam: CDAI Score: -- Patient Global: --; Provider Global: -- Swollen: --; Tender: --  Joint Exam 05/17/2020   No joint exam has been documented for this visit   There is currently no information documented on the homunculus. Go to the Rheumatology activity and complete the homunculus joint exam.  Investigation: No additional findings.  Imaging: No results found.  Recent Labs: Lab Results  Component Value Date   WBC 9.7 03/24/2019   HGB 13.2 03/24/2019   PLT 331 03/24/2019   NA 140 03/24/2019   K 3.7 03/24/2019   CL 101 03/24/2019   CO2 30 03/24/2019   GLUCOSE 101 (H) 03/24/2019   BUN 14 03/24/2019   CREATININE 0.80 03/24/2019   BILITOT 0.2 (L) 03/24/2019   ALKPHOS 60 03/24/2019   AST 28 03/24/2019   ALT 49 (H) 03/24/2019   PROT 7.4 03/24/2019   ALBUMIN 4.4 03/24/2019   CALCIUM 9.4 03/24/2019   GFRAA >60 03/24/2019    Speciality Comments: No specialty comments available.  Procedures:  No procedures performed Allergies: Sulfa antibiotics   Assessment / Plan:     Visit Diagnoses: Raynaud's phenomenon without gangrene -she gives history of Raynaud's phenomenon for the last 6  years.  She describes that her hands, feet and her nose turns cold with reddish discoloration.  She has not noticed pallor in her hands.  She has noticed some purplish discoloration at times.  There is no history of nasal ulcers, malar rash, photosensitivity, inflammatory arthritis, lymphadenopathy.  There is no family history of autoimmune disease.  On examination no nailbed capillary changes, telangiectasia, sclerodactyly was noted.  I will obtain following labs today.  I also discussed with the patient that Raynaud's phenomenon could be seen in normal population without autoimmune disease.  Keeping core temperature warm and drinking warm liquids will be helpful.  Plan: Sedimentation rate, ANA, Beta-2 glycoprotein antibodies, Cardiolipin antibodies, IgG, IgM, IgA, Lupus Anticoagulant Eval w/Reflex, Cryoglobulin  Other fatigue -she gives history of fatigue.  I will obtain some basic labs.  Plan: CBC with Differential/Platelet, COMPLETE METABOLIC PANEL WITH GFR  Right cervical radiculopathy-currently not symptomatic.  She had problems in the past with cervical pain.  I reviewed x-rays from the past which were negative.  Tachycardia - Uses wheelchair due to severe orthostasis and tachycardia  Asperger's disorder  History of asthma  Mood disorder (HCC) - Considering ketamine treatments   Female-to-female transgender person  Trans-sexualism, status post gender reassignment surgery  Orders: Orders Placed This Encounter  Procedures  . CBC with Differential/Platelet  . COMPLETE METABOLIC PANEL WITH GFR  . Sedimentation rate  . ANA  . Beta-2 glycoprotein antibodies  . Cardiolipin antibodies, IgG, IgM, IgA  . Lupus Anticoagulant Eval w/Reflex  . Cryoglobulin   No orders of the defined types were placed in this encounter.    Follow-Up Instructions: Return for Raynauds phenomenon.   Pollyann Savoy, MD  Note - This record has been created using Animal nutritionist.  Chart creation errors  have been sought, but may not always  have been located. Such creation errors do not reflect on  the standard of medical care.,

## 2020-05-17 ENCOUNTER — Ambulatory Visit: Payer: No Typology Code available for payment source | Admitting: Rheumatology

## 2020-05-17 ENCOUNTER — Encounter: Payer: Self-pay | Admitting: Rheumatology

## 2020-05-17 ENCOUNTER — Other Ambulatory Visit: Payer: Self-pay

## 2020-05-17 ENCOUNTER — Telehealth: Payer: Self-pay

## 2020-05-17 VITALS — BP 116/79 | HR 101 | Resp 13 | Ht 64.0 in | Wt 120.0 lb

## 2020-05-17 DIAGNOSIS — Z789 Other specified health status: Secondary | ICD-10-CM

## 2020-05-17 DIAGNOSIS — I7300 Raynaud's syndrome without gangrene: Secondary | ICD-10-CM

## 2020-05-17 DIAGNOSIS — R5383 Other fatigue: Secondary | ICD-10-CM | POA: Diagnosis not present

## 2020-05-17 DIAGNOSIS — M5412 Radiculopathy, cervical region: Secondary | ICD-10-CM | POA: Diagnosis not present

## 2020-05-17 DIAGNOSIS — Z8709 Personal history of other diseases of the respiratory system: Secondary | ICD-10-CM

## 2020-05-17 DIAGNOSIS — R000 Tachycardia, unspecified: Secondary | ICD-10-CM | POA: Diagnosis not present

## 2020-05-17 DIAGNOSIS — F845 Asperger's syndrome: Secondary | ICD-10-CM

## 2020-05-17 DIAGNOSIS — Z87890 Personal history of sex reassignment: Secondary | ICD-10-CM

## 2020-05-17 DIAGNOSIS — F39 Unspecified mood [affective] disorder: Secondary | ICD-10-CM

## 2020-05-17 NOTE — Telephone Encounter (Signed)
Per Dr. Corliss Skains, please cancel follow up visit if all labs are negative. Thanks!

## 2020-05-24 LAB — LUPUS ANTICOAGULANT EVAL W/ REFLEX
PTT-LA Screen: 34 s (ref <=40)
dRVVT: 36 s (ref <=45)

## 2020-05-24 LAB — COMPLETE METABOLIC PANEL WITH GFR
AG Ratio: 2.1 (calc) (ref 1.0–2.5)
ALT: 11 U/L (ref 6–29)
AST: 15 U/L (ref 10–30)
Albumin: 4.7 g/dL (ref 3.6–5.1)
Alkaline phosphatase (APISO): 61 U/L (ref 31–125)
BUN: 11 mg/dL (ref 7–25)
CO2: 30 mmol/L (ref 20–32)
Calcium: 9.5 mg/dL (ref 8.6–10.2)
Chloride: 104 mmol/L (ref 98–110)
Creat: 0.82 mg/dL (ref 0.50–1.10)
GFR, Est African American: 113 mL/min/{1.73_m2} (ref >=60)
GFR, Est Non African American: 97 mL/min/{1.73_m2} (ref >=60)
Globulin: 2.2 g/dL (calc) (ref 1.9–3.7)
Glucose, Bld: 68 mg/dL (ref 65–99)
Potassium: 4.1 mmol/L (ref 3.5–5.3)
Sodium: 140 mmol/L (ref 135–146)
Total Bilirubin: 0.3 mg/dL (ref 0.2–1.2)
Total Protein: 6.9 g/dL (ref 6.1–8.1)

## 2020-05-24 LAB — CARDIOLIPIN ANTIBODIES, IGG, IGM, IGA
Anticardiolipin IgA: <2 APL-U/mL
Anticardiolipin IgG: <2 GPL-U/mL
Anticardiolipin IgM: <2 MPL-U/mL

## 2020-05-24 LAB — CBC WITH DIFFERENTIAL/PLATELET
Absolute Monocytes: 766 cells/uL (ref 200–950)
Basophils Absolute: 62 cells/uL (ref 0–200)
Basophils Relative: 0.7 %
Eosinophils Absolute: 308 cells/uL (ref 15–500)
Eosinophils Relative: 3.5 %
HCT: 40.9 % (ref 35.0–45.0)
Hemoglobin: 13.8 g/dL (ref 11.7–15.5)
Lymphs Abs: 2262 cells/uL (ref 850–3900)
MCH: 29.4 pg (ref 27.0–33.0)
MCHC: 33.7 g/dL (ref 32.0–36.0)
MCV: 87.2 fL (ref 80.0–100.0)
MPV: 9.7 fL (ref 7.5–12.5)
Monocytes Relative: 8.7 %
Neutro Abs: 5403 cells/uL (ref 1500–7800)
Neutrophils Relative %: 61.4 %
Platelets: 332 10*3/uL (ref 140–400)
RBC: 4.69 10*6/uL (ref 3.80–5.10)
RDW: 12.4 % (ref 11.0–15.0)
Total Lymphocyte: 25.7 %
WBC: 8.8 10*3/uL (ref 3.8–10.8)

## 2020-05-24 LAB — CRYOGLOBULIN: Cryoglobulin, Qualitative Analysis: NOT DETECTED

## 2020-05-24 LAB — BETA-2 GLYCOPROTEIN ANTIBODIES
Beta-2 Glyco 1 IgA: <2 U/mL
Beta-2 Glyco 1 IgM: <2 U/mL
Beta-2 Glyco I IgG: <2 U/mL

## 2020-05-24 LAB — SEDIMENTATION RATE: Sed Rate: 2 mm/h (ref 0–20)

## 2020-05-24 LAB — ANA: Anti Nuclear Antibody (ANA): NEGATIVE

## 2020-05-25 NOTE — Telephone Encounter (Signed)
See lab result note for details.

## 2020-05-25 NOTE — Progress Notes (Signed)
All the lab results are within normal limits.  Please cancel the follow-up appointment if patient is in agreement.  I discussed canceling follow-up visit with the patient if the labs were normal.

## 2020-05-26 ENCOUNTER — Encounter: Payer: Self-pay | Admitting: *Deleted

## 2020-06-13 ENCOUNTER — Ambulatory Visit: Payer: No Typology Code available for payment source | Admitting: Rheumatology

## 2020-08-11 ENCOUNTER — Other Ambulatory Visit: Payer: Self-pay

## 2020-08-11 ENCOUNTER — Emergency Department (HOSPITAL_COMMUNITY)
Admission: EM | Admit: 2020-08-11 | Discharge: 2020-08-11 | Disposition: A | Discharge status: Home / Self Care | Payer: Medicaid Other | Attending: Emergency Medicine | Admitting: Emergency Medicine

## 2020-08-11 ENCOUNTER — Encounter (HOSPITAL_COMMUNITY): Payer: Self-pay

## 2020-08-11 DIAGNOSIS — F840 Autistic disorder: Secondary | ICD-10-CM | POA: Insufficient documentation

## 2020-08-11 DIAGNOSIS — J45909 Unspecified asthma, uncomplicated: Secondary | ICD-10-CM | POA: Insufficient documentation

## 2020-08-11 DIAGNOSIS — R42 Dizziness and giddiness: Secondary | ICD-10-CM | POA: Diagnosis not present

## 2020-08-11 DIAGNOSIS — M436 Torticollis: Secondary | ICD-10-CM | POA: Insufficient documentation

## 2020-08-11 DIAGNOSIS — M542 Cervicalgia: Secondary | ICD-10-CM | POA: Diagnosis present

## 2020-08-11 MED ORDER — KETOROLAC TROMETHAMINE 60 MG/2ML IM SOLN
30.0000 mg | Freq: Once | INTRAMUSCULAR | Status: AC
  Administered 2020-08-11: 30 mg via INTRAMUSCULAR
  Filled 2020-08-11: qty 2

## 2020-08-11 MED ORDER — DIAZEPAM 5 MG PO TABS
5.0000 mg | ORAL_TABLET | Freq: Once | ORAL | Status: AC
  Administered 2020-08-11: 5 mg via ORAL
  Filled 2020-08-11: qty 1

## 2020-08-11 MED ORDER — ORPHENADRINE CITRATE ER 100 MG PO TB12
100.0000 mg | ORAL_TABLET | Freq: Two times a day (BID) | ORAL | 0 refills | Status: DC
Start: 2020-08-11 — End: 2021-10-29

## 2020-08-11 MED ORDER — MELOXICAM 7.5 MG PO TABS
7.5000 mg | ORAL_TABLET | Freq: Every day | ORAL | 0 refills | Status: DC
Start: 2020-08-11 — End: 2021-10-29

## 2020-08-11 MED ORDER — LIDOCAINE 5 % EX PTCH
1.0000 | MEDICATED_PATCH | CUTANEOUS | Status: DC
  Administered 2020-08-11: 1 via TRANSDERMAL
  Filled 2020-08-11: qty 1

## 2020-08-11 MED ORDER — LIDOCAINE 5 % EX PTCH: 1.0000 | MEDICATED_PATCH | CUTANEOUS | 0 refills | Status: AC

## 2020-08-11 NOTE — ED Provider Notes (Signed)
[facility]-EMERGENCY DEPT Provider Note   CSN: [id number] Arrival date & time: 08/11/20  1207     History Chief Complaint  Patient presents with  . Neck Pain  . hand tingling    Maria Bond is a 30 y.o. adult.  30 year old female with complaint of neck pain, unable to turn head to the right due to pain.  Denies falls or injuries.  States that she had an episode of room spinning dizziness on Tuesday (2 days ago), none since.  States that she has had to do PT in the past for tingling in her right hand, not having any tingling or weakness today.  Patient was given Valium at triage without improvement in her symptoms, states that she was previously treated with long-term use of benzos and feels this might not help.        Past Medical History:  Diagnosis Date  . ADHD (attention deficit hyperactivity disorder)   . Allergy   . Anemia   . Anxiety   . Asthma   . Autistic disorder   . Depression   . POTS (postural orthostatic tachycardia syndrome)   . Tachycardia     Patient Active Problem List   Diagnosis Date Noted  . Tachycardia 11/05/2018  . Hypotension 11/05/2018  . Female-to-female transgender person 01/12/2018  . Radicular pain in right arm 12/05/2017  . Right cervical radiculopathy 12/05/2017  . Need for prophylactic vaccination and inoculation against influenza 12/05/2017  . Depression 04/27/2014  . Asperger's disorder 12/06/2011  . Anxiety 12/06/2011    Past Surgical History:  Procedure Laterality Date  . CHOLECYSTECTOMY  2020  . TONSILLECTOMY     as a child   . VAGINOPLASTY  08/26/2015     OB History   No obstetric history on file.     Family History  Problem Relation Age of Onset  . Healthy Mother   . Celiac disease Mother   . Irritable bowel syndrome Mother   . Arthritis Father 67       degenerative disc disease  . Heart disease Father   . Healthy Brother   . Stroke Paternal Grandmother   . Heart disease Paternal  Grandfather   . Colon polyps Maternal Aunt   . Irritable bowel syndrome Maternal Aunt   . Colon cancer Neg Hx   . Esophageal cancer Neg Hx   . Rectal cancer Neg Hx   . Stomach cancer Neg Hx     Social History   Tobacco Use  . Smoking status: Never Smoker  . Smokeless tobacco: Never Used  Vaping Use  . Vaping Use: Never used  Substance Use Topics  . Alcohol use: No  . Drug use: No    Home Medications Prior to Admission medications   Medication Sig Start Date End Date Taking? Authorizing Provider  lidocaine (LIDODERM) 5 % Place 1 patch onto the skin daily. Remove & Discard patch within 12 hours or as directed by MD 08/11/20  Yes Jeannie Fend, PA-C  meloxicam (MOBIC) 7.5 MG tablet Take 1 tablet (7.5 mg total) by mouth daily. 08/11/20  Yes Jeannie Fend, PA-C  orphenadrine (NORFLEX) 100 MG tablet Take 1 tablet (100 mg total) by mouth 2 (two) times daily. 08/11/20  Yes Jeannie Fend, PA-C  acetaminophen (TYLENOL) 500 MG tablet Take 500 mg by mouth every 6 (six) hours as needed for mild pain.    [provider]  albuterol (VENTOLIN HFA) 108 (90 Base) MCG/ACT inhaler Inhale 2  puffs into the lungs 2 (two) times daily as needed. 07/27/18   [provider]  busPIRone (BUSPAR) 15 MG tablet Take 15 mg by mouth 2 (two) times daily.    [provider]  cyclobenzaprine (FLEXERIL) 10 MG tablet Take 10 mg by mouth 3 (three) times daily as needed for muscle spasms.    [provider]  desvenlafaxine (PRISTIQ) 100 MG 24 hr tablet Take 100 mg by mouth daily. 02/01/19   [provider]  diphenhydrAMINE (BENADRYL) 50 MG tablet Take 50 mg by mouth at bedtime as needed for sleep.    [provider]  Esketamine HCl (SPRAVATO, 84 MG DOSE, NA) Place into the nose every 14 (fourteen) days.    [provider]  estradiol (ESTRACE) 2 MG tablet Take 1.5 tablets (3 mg total) by mouth daily. 11/12/17   Sherren Mocha, MD  gabapentin (NEURONTIN) 100 MG  capsule Take 600 mg by mouth daily.  10/06/18   [provider]  memantine (NAMENDA) 10 MG tablet Take 10 mg by mouth 2 (two) times daily. 11/05/19   [provider]  Multiple Vitamin (MULTIVITAMIN WITH MINERALS) TABS tablet Take 1 tablet by mouth daily.    [provider]  propranolol (INDERAL) 10 MG tablet Take 1 tablet (10 mg total) by mouth 2 (two) times daily. Please call to schedule appt with Dr Graciela Husbands for future refills (1st attempt) 06/05/18   Duke Salvia, MD  SUMAtriptan [facility]) 5 MG/ACT nasal spray Place 1 spray into the nose daily as needed for migraine. 03/16/19   [provider]  VYVANSE 20 MG capsule Take 40 mg by mouth daily. 10/20/18   [provider]    Allergies    Sulfa antibiotics  Review of Systems   Review of Systems  Constitutional: Negative for fever.  Musculoskeletal: Positive for neck pain and neck stiffness.  Skin: Negative for rash and wound.  Allergic/Immunologic: Negative for immunocompromised state.  Neurological: Negative for weakness and numbness.  All other systems reviewed and are negative.   Physical Exam Updated Vital Signs BP 103/73   Pulse 69   Temp 98.7 F (37.1 C) (Oral)   Resp 18   Ht 5\' 4"  (1.626 m)   Wt 54.4 kg   SpO2 98%   BMI 20.60 kg/m   Physical Exam Vitals and nursing note reviewed.  Constitutional:      General: She is not in acute distress.    Appearance: Normal appearance. She is not ill-appearing.  HENT:     Head: Normocephalic and atraumatic.  Eyes:     Extraocular Movements: Extraocular movements intact.     Pupils: Pupils are equal, round, and reactive to light.  Cardiovascular:     Pulses: Normal pulses.  Musculoskeletal:        General: Tenderness present. No swelling or deformity.     Cervical back: Tenderness present.     Comments: Head held tilted to the left.  Tenderness along left SCM with palpable spasm of left trapezius.  Equal sensation in arms with equal  hand strength.  Skin:    General: Skin is warm and dry.     Findings: No erythema or rash.  Neurological:     Mental Status: She is alert and oriented to person, place, and time.     Sensory: No sensory deficit.     Motor: No weakness.     ED Results / Procedures / Treatments   Labs (all labs ordered are listed, but  only abnormal results are displayed) Labs Reviewed - No data to display  EKG None  Radiology No results found.  Procedures Procedures   Medications Ordered in ED Medications  diazepam (VALIUM) tablet 5 mg (5 mg Oral Given 08/11/20 1301)  ketorolac (TORADOL) injection 30 mg (30 mg Intramuscular Given 08/11/20 1502)    ED Course  I have reviewed the triage vital signs and the nursing notes.  Pertinent labs & imaging results that were available during my care of the patient were reviewed by me and considered in my medical decision making (see chart for details).  Clinical Course as of 08/12/20 0900  Sat Aug 12, 2020  8041 30 year old female with complaint of neck pain/stiffness upon waking this morning, worse trying to turn head, head held to left side. Found to have tenderness over left SCM with palpable spasm of the left trapezius. Sensation intact to extremities with equal grip strength.  Patient was given Valium in triage which has not helped substantially, she believes this is because she was previously on benzos.  Plan is to give IM Toradol, apply Lidoderm patch, will discharge with prescription for muscle relaxant, NSAID, Lidoderm patches with plan to follow-up with PCP. [LM]    Clinical Course User Index [LM] Alden Hipp   MDM Rules/Calculators/A&P                          Final Clinical Impression(s) / ED Diagnoses Final diagnoses:  Torticollis, acute    Rx / DC Orders ED Discharge Orders         Ordered    orphenadrine (NORFLEX) 100 MG tablet  2 times daily        08/11/20 1446    meloxicam (MOBIC) 7.5 MG tablet  Daily        08/11/20  1446    lidocaine (LIDODERM) 5 %  Every 24 hours        08/11/20 1446           Jeannie Fend, PA-C 08/12/20 0900    Linwood Dibbles, MD 08/13/20 [id number]

## 2020-08-11 NOTE — ED Provider Notes (Signed)
Emergency Medicine Provider Triage Evaluation Note  Maria Bond , a 30 y.o. adult  was evaluated in triage.  Pt complains of torticollis.  Review of Systems  Positive: Neck pain, tingling sensation to R hand (4th-5th fingers) Negative: Fever, trauma, weakness  Physical Exam  BP (!) 116/91 (BP Location: Right Arm)   Pulse 100   Temp 99.1 F (37.3 C) (Oral)   Resp 14   Ht 5\' 4"  (1.626 m)   Wt 54.4 kg   SpO2 97%   BMI 20.60 kg/m  Gen:   Awake, no distress.  Sat on wheelchair Resp:  Normal effort  MSK:   Moves extremities without difficulty Other:  Left lateral position of neck with tenderness along right paracervical spinal muscle and spasm to left SCM  Medical Decision Making  Medically screening exam initiated at 12:52 PM.  Appropriate orders placed.  was informed that the remainder of the evaluation will be completed by another provider, this initial triage assessment does not replace that evaluation, and the importance of remaining in the ED until their evaluation is complete.  Neck crick since this AM, no trauma, tingling sensation to R hand (4th-5th) fingers.  Valium given.    Dorathy Daft, PA-C 08/11/20 1254    10/11/20, MD 08/12/20 [phone]

## 2020-08-11 NOTE — Discharge Instructions (Signed)
Take Meloxicam and Norflex as prescribed as needed. Apply lidoderm patch as needed as prescribed. Warm compresses for 20 minutes at a time followed by gentle stretching. Recommend recheck with your doctor on Monday, may need referral to physical therapy.

## 2020-08-11 NOTE — ED Triage Notes (Signed)
Patient c/o waking up with neck pain and dizziness today. Patient states she is unable to turn her head all the right to the right.  Patient states she has had right hand tingling for years, but has numbness to the right 4th and 5th fingers that started 6 days ago. Patient states her PCP prescribed Flexeril for the same. Patient states has helped some.

## 2021-06-06 HISTORY — PX: RELEASE, CUBITAL TUNNEL: SHX5074

## 2021-10-29 ENCOUNTER — Ambulatory Visit
Admission: EM | Admit: 2021-10-29 | Discharge: 2021-10-29 | Disposition: A | Discharge status: Home / Self Care | Payer: Medicaid Other | Attending: Emergency Medicine | Admitting: Emergency Medicine

## 2021-10-29 DIAGNOSIS — J4541 Moderate persistent asthma with (acute) exacerbation: Secondary | ICD-10-CM

## 2021-10-29 MED ORDER — FLUTICASONE-SALMETEROL 100-50 MCG/ACT IN AEPB: 1.0000 | INHALATION_SPRAY | Freq: Two times a day (BID) | RESPIRATORY_TRACT | 0 refills | Status: AC

## 2021-10-29 MED ORDER — METHYLPREDNISOLONE 4 MG PO TBPK: ORAL_TABLET | ORAL | 0 refills | Status: AC

## 2021-10-29 MED ORDER — ALBUTEROL SULFATE HFA 108 (90 BASE) MCG/ACT IN AERS: 2.0000 | INHALATION_SPRAY | Freq: Four times a day (QID) | RESPIRATORY_TRACT | 0 refills | Status: AC | PRN

## 2021-10-29 MED ORDER — DEXAMETHASONE SODIUM PHOSPHATE 10 MG/ML IJ SOLN
10.0000 mg | Freq: Once | INTRAMUSCULAR | Status: AC
  Administered 2021-10-29: 10 mg via INTRAMUSCULAR

## 2021-10-29 NOTE — Discharge Instructions (Addendum)
Your symptoms and my physical exam findings are concerning for exacerbation of your underlying asthma.  It is important that you are consistent with taking your inhaled medications exactly as prescribed.      Please see the list below for recommended medications, dosages and frequencies to provide relief of your current symptoms:    Solu-Medrol IM (methylprednisolone):  To quickly address your significant respiratory inflammation, you were provided with an injection of Solu-Medrol in the office today.  You should continue to feel the full benefit of the steroid for the next 4 to 6 hours.    Medrol Dosepak (methylprednisolone): This is a steroid that will significantly calm your upper and lower airways, please take one row of tablets daily with your breakfast meal starting tomorrow morning until the prescription is complete.       ProAir, Ventolin, Proventil (albuterol): This inhaled medication contains a short acting beta agonist bronchodilator.  This medication works on the smooth muscle that opens and constricts of your airways by relaxing the muscle.  The result of relaxation of the smooth muscle is increased air movement and improved work of breathing.  This is a short acting medication that can be used every 4-6 hours as needed for increased work of breathing, shortness of breath, wheezing and excessive coughing.     Advair (fluticasone and salmeterol): Please inhale 2 puffs twice daily.  This inhaled medication contains a corticosteroid and long-acting form of albuterol.  The inhaled steroid and this medication  is not absorbed into the body and will not cause side effects such as increased blood sugar levels, irritability, sleeplessness or weight gain.  Inhaled corticosteroid are sort of like topical steroid creams but, as you can imagine, it is not practical to attempt to rub a steroid cream inside of your lungs.  The long-acting albuterol works similarly to the short acting albuterol found in your  rescue inhaler but provides 24-hour relaxation of the smooth muscles that open and constrict your airways; your short acting rescue inhaler can only provide for a few hours this benefit for a few hours.  Please feel free to continue using your short acting rescue inhaler as often as needed throughout the day for shortness of breath, wheezing, and cough.   Please follow-up with either your primary care provider or with urgent care for repeat evaluation you of your lungs in the next 3 to 4 days to ensure that you are improving and also to evaluate whether or not your treatment regimen needs to be adjusted.   Thank you for visiting urgent care today.  We appreciate the opportunity to participate in your care.

## 2021-10-29 NOTE — ED Triage Notes (Signed)
The patient c/o SOB that began 3 days ago ( the pt states she does have asthma).  Home interventions: albuterol inhaler (last used today around 6pm)

## 2021-10-29 NOTE — ED Provider Notes (Signed)
[facility]    CSN: [id number] Arrival date & time: 10/29/21  1855    HISTORY   Chief Complaint  Patient presents with   Shortness of Breath   HPI Maria Bond is a pleasant, 31 y.o. adult who presents to urgent care today. The patient c/o SOB that began 3 days ago which has not been meaningfully relieved by care albuterol inhaler.  Patient reports a history of asthma, was prescribed a maintenance inhaler when they were younger but this is no longer been prescribed for them.  Patient reports exacerbations of their asthma 2-3 times a year usually requiring a course of steroids.  Patient has normal vital signs on arrival and appears to be in no acute distress.  The history is provided by the patient.   Past Medical History:  Diagnosis Date   ADHD (attention deficit hyperactivity disorder)    Allergy    Anemia    Anxiety    Asthma    Autistic disorder    Depression    POTS (postural orthostatic tachycardia syndrome)    Tachycardia    Patient Active Problem List   Diagnosis Date Noted   Tachycardia 11/05/2018   Hypotension 11/05/2018   Female-to-female transgender person 01/12/2018   Radicular pain in right arm 12/05/2017   Right cervical radiculopathy 12/05/2017   Need for prophylactic vaccination and inoculation against influenza 12/05/2017   Depression 04/27/2014   Asperger's disorder 12/06/2011   Anxiety 12/06/2011   Past Surgical History:  Procedure Laterality Date   CHOLECYSTECTOMY  2020   TONSILLECTOMY     as a child    VAGINOPLASTY  08/26/2015   OB History   No obstetric history on file.    Home Medications    Prior to Admission medications   Medication Sig Start Date End Date Taking? Authorizing Provider  acetaminophen (TYLENOL) 500 MG tablet Take 500 mg by mouth every 6 (six) hours as needed for mild pain.    [provider]  albuterol (VENTOLIN HFA) 108 (90 Base) MCG/ACT inhaler Inhale 2 puffs into the lungs 2 (two) times daily  as needed. 07/27/18   [provider]  busPIRone (BUSPAR) 15 MG tablet Take 15 mg by mouth 2 (two) times daily.    [provider]  cyclobenzaprine (FLEXERIL) 10 MG tablet Take 10 mg by mouth 3 (three) times daily as needed for muscle spasms.    [provider]  desvenlafaxine (PRISTIQ) 100 MG 24 hr tablet Take 100 mg by mouth daily. 02/01/19   [provider]  diphenhydrAMINE (BENADRYL) 50 MG tablet Take 50 mg by mouth at bedtime as needed for sleep.    [provider]  Esketamine HCl (SPRAVATO, 84 MG DOSE, NA) Place into the nose every 14 (fourteen) days.    [provider]  estradiol (ESTRACE) 2 MG tablet Take 1.5 tablets (3 mg total) by mouth daily. 11/12/17   Sherren Mocha, MD  gabapentin (NEURONTIN) 100 MG capsule Take 600 mg by mouth daily.  10/06/18   [provider]  lidocaine (LIDODERM) 5 % Place 1 patch onto the skin daily. Remove & Discard patch within 12 hours or as directed by MD 08/11/20   Jeannie Fend, PA-C  meloxicam (MOBIC) 7.5 MG tablet Take 1 tablet (7.5 mg total) by mouth daily. 08/11/20   Jeannie Fend, PA-C  memantine (NAMENDA) 10 MG tablet Take 10 mg by mouth 2 (two) times daily. 11/05/19   [provider]  Multiple Vitamin (MULTIVITAMIN  WITH MINERALS) TABS tablet Take 1 tablet by mouth daily.    [provider]  orphenadrine (NORFLEX) 100 MG tablet Take 1 tablet (100 mg total) by mouth 2 (two) times daily. 08/11/20   Jeannie Fend, PA-C  propranolol (INDERAL) 10 MG tablet Take 1 tablet (10 mg total) by mouth 2 (two) times daily. Please call to schedule appt with Dr Graciela Husbands for future refills (1st attempt) 06/05/18   Duke Salvia, MD  SUMAtriptan [facility]) 5 MG/ACT nasal spray Place 1 spray into the nose daily as needed for migraine. 03/16/19   [provider]  VYVANSE 20 MG capsule Take 40 mg by mouth daily. 10/20/18   [provider]    Family History Family History  Problem  Relation Age of Onset   Healthy Mother    Celiac disease Mother    Irritable bowel syndrome Mother    Arthritis Father 46       degenerative disc disease   Heart disease Father    Healthy Brother    Stroke Paternal Grandmother    Heart disease Paternal Grandfather    Colon polyps Maternal Aunt    Irritable bowel syndrome Maternal Aunt    Colon cancer Neg Hx    Esophageal cancer Neg Hx    Rectal cancer Neg Hx    Stomach cancer Neg Hx    Social History Social History   Tobacco Use   Smoking status: Never   Smokeless tobacco: Never  Vaping Use   Vaping Use: Never used  Substance Use Topics   Alcohol use: No   Drug use: No   Allergies   Sulfa antibiotics  Review of Systems Review of Systems Pertinent findings revealed after performing a 14 point review of systems has been noted in the history of present illness.  Physical Exam Triage Vital Signs ED Triage Vitals  Enc Vitals Group     BP 02/02/21 0827 (!) 147/82     Pulse Rate 02/02/21 0827 72     Resp 02/02/21 0827 18     Temp 02/02/21 0827 98.3 F (36.8 C)     Temp Source 02/02/21 0827 Oral     SpO2 02/02/21 0827 98 %     Weight --      Height --      Head Circumference --      Peak Flow --      Pain Score 02/02/21 0826 5     Pain Loc --      Pain Edu? --      Excl. in GC? --   No data found.  Updated Vital Signs BP 102/77 (BP Location: Left Arm)   Pulse 94   Temp 98 F (36.7 C) (Oral)   Resp 16   SpO2 98%   Physical Exam Vitals and nursing note reviewed.  Constitutional:      General: She is not in acute distress.    Appearance: Normal appearance. She is not ill-appearing.  HENT:     Head: Normocephalic and atraumatic.     Salivary Glands: Right salivary gland is not diffusely enlarged or tender. Left salivary gland is not diffusely enlarged or tender.     Right Ear: Tympanic membrane, ear canal and external ear normal. No drainage. No middle ear effusion. There is no impacted cerumen. Tympanic  membrane is not erythematous or bulging.     Left Ear: Tympanic membrane, ear canal and external ear normal. No drainage.  No middle ear effusion. There is no  impacted cerumen. Tympanic membrane is not erythematous or bulging.     Nose: Nose normal. No nasal deformity, septal deviation, mucosal edema, congestion or rhinorrhea.     Right Turbinates: Not enlarged, swollen or pale.     Left Turbinates: Not enlarged, swollen or pale.     Right Sinus: No maxillary sinus tenderness or frontal sinus tenderness.     Left Sinus: No maxillary sinus tenderness or frontal sinus tenderness.     Mouth/Throat:     Lips: Pink. No lesions.     Mouth: Mucous membranes are moist. No oral lesions.     Pharynx: Oropharynx is clear. Uvula midline. No posterior oropharyngeal erythema or uvula swelling.     Tonsils: No tonsillar exudate. 0 on the right. 0 on the left.  Eyes:     General: Lids are normal.        Right eye: No discharge.        Left eye: No discharge.     Extraocular Movements: Extraocular movements intact.     Conjunctiva/sclera: Conjunctivae normal.     Right eye: Right conjunctiva is not injected.     Left eye: Left conjunctiva is not injected.  Neck:     Trachea: Trachea and phonation normal.  Cardiovascular:     Rate and Rhythm: Normal rate and regular rhythm.     Pulses: Normal pulses.     Heart sounds: Normal heart sounds. No murmur heard.    No friction rub. No gallop.  Pulmonary:     Effort: Pulmonary effort is normal. No tachypnea, bradypnea, accessory muscle usage, prolonged expiration, respiratory distress or retractions.     Breath sounds: Normal breath sounds. No stridor, decreased air movement or transmitted upper airway sounds. No decreased breath sounds, wheezing, rhonchi or rales.  Chest:     Chest wall: No tenderness.  Musculoskeletal:        General: Normal range of motion.     Cervical back: Normal range of motion and neck supple. Normal range of motion.   Lymphadenopathy:     Cervical: No cervical adenopathy.  Skin:    General: Skin is warm and dry.     Findings: No erythema or rash.  Neurological:     General: No focal deficit present.     Mental Status: She is alert and oriented to person, place, and time.  Psychiatric:        Mood and Affect: Mood normal.        Behavior: Behavior normal.     Visual Acuity Right Eye Distance:   Left Eye Distance:   Bilateral Distance:    Right Eye Near:   Left Eye Near:    Bilateral Near:     UC Couse / Diagnostics / Procedures:     Radiology No results found.  Procedures Procedures (including critical care time) EKG  Pending results:  Labs Reviewed - No data to display  Medications Ordered in UC: Medications  dexamethasone (DECADRON) injection 10 mg (has no administration in time range)    UC Diagnoses / Final Clinical Impressions(s)   I have reviewed the triage vital signs and the nursing notes.  Pertinent labs & imaging results that were available during my care of the patient were reviewed by me and considered in my medical decision making (see chart for details).    Final diagnoses:  Moderate persistent asthma with acute exacerbation in adult   Patient is in no acute distress at this time but complaining of shortness of  breath for the past 2 days.  Patient states SOB is not relieved with albuterol inhaler.  Patient requesting steroids today.  Patient provided with steroid injection and Medrol Dosepak along with a maintenance inhaler.  Note, patient states they are to follow-up with their primary care provider in 3 days.  Advised patient that this could so they can discuss further management of the finger not well controlled asthma.  Return precautions advised.  ED Prescriptions     Medication Sig Dispense Auth. Provider   methylPREDNISolone (MEDROL DOSEPAK) 4 MG TBPK tablet Take 24 mg on day 1, 20 mg on day 2, 16 mg on day 3, 12 mg on day 4, 8 mg on day 5, 4 mg on day 6.   Take all tablets in each row at once, do not spread tablets out throughout the day. 21 tablet Theadora Rama Scales, PA-C   albuterol (VENTOLIN HFA) 108 (90 Base) MCG/ACT inhaler Inhale 2 puffs into the lungs every 6 (six) hours as needed for wheezing or shortness of breath (Cough). 18 g Theadora Rama Scales, PA-C   fluticasone-salmeterol (ADVAIR) 100-50 MCG/ACT AEPB Inhale 1 puff into the lungs 2 (two) times daily. 60 each Theadora Rama Scales, PA-C      PDMP not reviewed this encounter.  Disposition Upon Discharge:  Condition: stable for discharge home Home: take medications as prescribed; routine discharge instructions as discussed; follow up as advised.  Patient presented with an acute illness with associated systemic symptoms and significant discomfort requiring urgent management. In my opinion, this is a condition that a prudent lay person (someone who possesses an average knowledge of health and medicine) may potentially expect to result in complications if not addressed urgently such as respiratory distress, impairment of bodily function or dysfunction of bodily organs.   Routine symptom specific, illness specific and/or disease specific instructions were discussed with the patient and/or caregiver at length.   As such, the patient has been evaluated and assessed, work-up was performed and treatment was provided in alignment with urgent care protocols and evidence based medicine.  Patient/parent/caregiver has been advised that the patient may require follow up for further testing and treatment if the symptoms continue in spite of treatment, as clinically indicated and appropriate.  If the patient was tested for COVID-19, Influenza and/or RSV, then the patient/parent/guardian was advised to isolate at home pending the results of his/her diagnostic coronavirus test and potentially longer if they're positive. I have also advised pt that if his/her COVID-19 test returns positive, it's  recommended to self-isolate for at least 10 days after symptoms first appeared AND until fever-free for 24 hours without fever reducer AND other symptoms have improved or resolved. Discussed self-isolation recommendations as well as instructions for household member/close contacts as per the [facility] and [place] DHHS, and also gave patient the COVID packet with this information.  Patient/parent/caregiver has been advised to return to the [facility] or PCP in 3-5 days if no better; to PCP or the Emergency Department if new signs and symptoms develop, or if the current signs or symptoms continue to change or worsen for further workup, evaluation and treatment as clinically indicated and appropriate  The patient will follow up with their current PCP if and as advised. If the patient does not currently have a PCP we will assist them in obtaining one.   The patient may need specialty follow up if the symptoms continue, in spite of conservative treatment and management, for further workup, evaluation, consultation and treatment as clinically indicated  and appropriate.  Patient/parent/caregiver verbalized understanding and agreement of plan as discussed.  All questions were addressed during visit.  Please see discharge instructions below for further details of plan.  Discharge Instructions:   Discharge Instructions      Your symptoms and my physical exam findings are concerning for exacerbation of your underlying asthma.  It is important that you are consistent with taking your inhaled medications exactly as prescribed.      Please see the list below for recommended medications, dosages and frequencies to provide relief of your current symptoms:    Solu-Medrol IM (methylprednisolone):  To quickly address your significant respiratory inflammation, you were provided with an injection of Solu-Medrol in the office today.  You should continue to feel the full benefit of the steroid for the next 4 to 6 hours.    Medrol  Dosepak (methylprednisolone): This is a steroid that will significantly calm your upper and lower airways, please take one row of tablets daily with your breakfast meal starting tomorrow morning until the prescription is complete.       ProAir, Ventolin, Proventil (albuterol): This inhaled medication contains a short acting beta agonist bronchodilator.  This medication works on the smooth muscle that opens and constricts of your airways by relaxing the muscle.  The result of relaxation of the smooth muscle is increased air movement and improved work of breathing.  This is a short acting medication that can be used every 4-6 hours as needed for increased work of breathing, shortness of breath, wheezing and excessive coughing.     Advair (fluticasone and salmeterol): Please inhale 2 puffs twice daily.  This inhaled medication contains a corticosteroid and long-acting form of albuterol.  The inhaled steroid and this medication  is not absorbed into the body and will not cause side effects such as increased blood sugar levels, irritability, sleeplessness or weight gain.  Inhaled corticosteroid are sort of like topical steroid creams but, as you can imagine, it is not practical to attempt to rub a steroid cream inside of your lungs.  The long-acting albuterol works similarly to the short acting albuterol found in your rescue inhaler but provides 24-hour relaxation of the smooth muscles that open and constrict your airways; your short acting rescue inhaler can only provide for a few hours this benefit for a few hours.  Please feel free to continue using your short acting rescue inhaler as often as needed throughout the day for shortness of breath, wheezing, and cough.   Please follow-up with either your primary care provider or with urgent care for repeat evaluation you of your lungs in the next 3 to 4 days to ensure that you are improving and also to evaluate whether or not your treatment regimen needs to be  adjusted.   Thank you for visiting urgent care today.  We appreciate the opportunity to participate in your care.       This office note has been dictated using Teaching laboratory technician.  Unfortunately, this method of dictation can sometimes lead to typographical or grammatical errors.  I apologize for your inconvenience in advance if this occurs.  Please do not hesitate to reach out to me if clarification is needed.      Theadora Rama Scales, [place] 10/30/21 [phone]

## 2022-08-14 ENCOUNTER — Encounter (INDEPENDENT_AMBULATORY_CARE_PROVIDER_SITE_OTHER): Payer: Self-pay | Admitting: Sports Medicine

## 2022-08-14 ENCOUNTER — Ambulatory Visit (INDEPENDENT_AMBULATORY_CARE_PROVIDER_SITE_OTHER): Payer: Medicaid Other

## 2022-08-14 ENCOUNTER — Ambulatory Visit (INDEPENDENT_AMBULATORY_CARE_PROVIDER_SITE_OTHER): Payer: Medicaid Other | Admitting: Sports Medicine

## 2022-08-14 VITALS — BP 111/78 | HR 94

## 2022-08-14 DIAGNOSIS — M25521 Pain in right elbow: Secondary | ICD-10-CM

## 2022-08-14 NOTE — Progress Notes (Signed)
[facility]    Provider: Valera Castle, MD  Date of Exam:  08/14/2022   Patient:  Elaine Mason  DOB:  [birth year]    AGE:  32 y.o.  MR#:  [id number]     Chief Complaint: Right hand burning/stinging.    HPI:  Elaine Mason is a pleasant 32 y.o.-year-old right HD female who presents today with a history of right hand burning and stinging.  She underwent a right cubital tunnel release on March 2024 in [place].  She states her symptoms began one day after her surgery.  She didn't follow-up with the original surgeon because she was moving to the area.  The burning/stinging are located in her volar wrist, base of thumb and long finger.  Symptoms are intermittent and are exacerbated with driving.  She is currently in a wheelchair for postural tachycardia.  No prior history of these symptoms prior to the surgery.  Elaine Mason is now here for further evaluation and discussion of treatment options.    For other past medical history, social history, family history and past surgical history please see them listed below.    Club/School/Work Affiliation:     Problem List: There is no problem list on file for this patient.       Past Medical History: History reviewed. No pertinent past medical history.    Social History:      Family History: History reviewed. No pertinent family history.    Past Surgical History: History reviewed. No pertinent surgical history.    Medications: Scheduled Meds:  Continuous Infusions:  PRN Meds:.     Allergies:   Allergies   Allergen Reactions    Sulfa Antibiotics        ROS:  Constitutional: No fatigue, fever, weight loss, or weight gain.   Ears, Nose, Mouth & Throat: No sore throat or hearing loss.   Cardiovascular: No chest pain, blood clots, or leg cramps.   Respiratory: No shortness of breath, cough, or difficulty breathing.   Gastrointestinal: No nausea, vomiting, diarrhea, or loss of appetite.   Genitourinary: No polyuria or kidney disease.   Musculoskeletal: No joint aches,  muscle weakness or swelling of joints/body parts other than that mentioned above.  Integumentary: No finger nail changes or skin dryness.   Neurological: No numbness, burning discomfort, or headaches.   Psychiatric: No depression or anxiety.   Endocrine: No increased thirst, change in appetite or thyroid disease.   Hematologic/Lymphatic: No easy bruising or anemia.     Exam:   Right Elbow Exam:  Skin Intact, incision well healed  Erythema None present  Swelling None present  Atrophy No identifiable  TTP at Medial epicondyle: None  TTP at Lateral epicondyle: None  TTP Olecranon: None  Other Areas of TTP None    ROM: 0-145  Strength: WNL    Pain with resisted wrist extension: Negative  Pain with resisted finger extension: Negative  Pain with resisted supination: Negative    Pain with resisted wrist flexion: Negative  Pain with resisted finger flexion: Negative  Pain with resisted pronation: Negative    Ulnar tinnel sign: mildly positive, but not in distribution of patient's symptoms  Negative tinnel sign at wrist, neg durkhan  Terminal flexion sign: Negative      Radial Pulse 2+  DTR and Pathological Reflexes Intact  No lymphadenopathy  Radial/Ulnar Nerves intact to motor and sensory provocation  Mildly decreased sensation over volar wrist, base of thumb and long finger.  Hand Perfused with CR <2  sec    Left Elbow Exam:  Skin Intact  Erythema None present  Swelling None present  Atrophy No identifiable  TTP at Medial epicondyle: None  TTP at Lateral epicondyle: None  TTP Olecranon: None  Other Areas of TTP None    ROM: 0-145  Strength: WNL    Pain with resisted wrist extension: Negative  Pain with resisted finger extension: Negative  Pain with resisted supination: Negative    Pain with resisted wrist flexion: Negative  Pain with resisted finger flexion: Negative  Pain with resisted pronation: Negative    Ulnar tinnel sign: Negative  Terminal flexion sign: Negative    Radial Pulse 2+  DTR and Pathological Reflexes  Intact  No lymphadenopathy  Radial/Median/Ulnar Nerves intact to motor and sensory provocation  Hand Perfused with CR <2 sec    Vitals: BP 111/78   Pulse 94     General: Elaine Mason was awake, alert, oriented, easily engaged, displayed logical thinking with clear speech and was neat in appearance. Her general appearance was normal, well-developed and well-nourished.    Gait: [name]     Studies: AP and lateral views of the right elbow were ordered and reviewed on today's visit. There are no signs of osteoarthritis. No acute fractures. There are no soft tissue calcifications. There is no evidence of a posterior fat pad sign. There are no sclerotic and cystic changes. There are no acute bony abnormalities.      Assessment/Plan: Elaine Mason is a pleasant 32 y.o. 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  I recommend that she follow-up with a hand specialist.  A list of surgeons was given to the patient.  I have asked the patient to avoid activities that exacerbate or worsen her symptoms. We will plan on follow up prn.  The patient will notify my clinic of any changes or worsening of their symptoms during the interim.    Based on the below 2023 Evaluation and Management Time Requirements, the appropriate level of service is [phone] based on the 45 minutes spent face-to-face with the patient along with the non-face-to-face time spent by me performing the following highlighted activities:      Preparing for the visit (such as reviewing tests)  Getting and/or reviewing a history that was separately obtained  Performing the exam  Counseling and providing education to the patient, family, or caregiver  Documenting information in the medical record  Interpreting results and sharing that information with the patient, family or caregiver  Ordering medicines, tests, or procedures  Communicating with other healthcare  professionals  Care coordination             The review of the patient's medications does not in any way constitute an endorsement, by this clinician,  of their use, dosage, indications, route, efficacy, interactions, or other clinical parameters.    This note may have been generated in part or in whole within the [employer] EMR using Dragon medical speech recognition software and may contain inherent errors or omissions not intended by the user. Grammatical and punctuation errors, random word insertions, deletions, pronoun errors and incomplete sentences are occasional consequences of this technology due to software limitations. Not all errors are caught or corrected.  Although every attempt is made to root out erroneus and incomplete transcription, the note may still not fully represent the intent or opinion of the author. If there are questions or concerns about the content of this  note or information contained within the body of this dictation they should be addressed directly with the author for clarification.    I, Valera Castle, MD, have personally performed the history, physical exam and medical decision making; and confirmed the accuracy of the information in the transcribed note.    Date: 08/19/2022 Time: 4:37 PM

## 2022-08-15 ENCOUNTER — Encounter (INDEPENDENT_AMBULATORY_CARE_PROVIDER_SITE_OTHER): Payer: Self-pay

## 2022-11-11 ENCOUNTER — Ambulatory Visit (INDEPENDENT_AMBULATORY_CARE_PROVIDER_SITE_OTHER): Payer: Medicaid Other | Admitting: Internal Medicine

## 2022-11-27 HISTORY — PX: CARPAL TUNNEL RELEASE: SHX101

## 2022-12-02 NOTE — Telephone Encounter (Signed)
Patient called, patient advised to continue taking benadryl and to continue to monitor for any progression. At this time denying any difficulty breathing, chest pain, or SOB

## 2022-12-04 NOTE — Telephone Encounter (Signed)
Refill sent however no more refills will be authorized at this time.

## 2022-12-17 ENCOUNTER — Emergency Department
Admission: EM | Admit: 2022-12-17 | Discharge: 2022-12-18 | Disposition: A | Discharge status: Home / Self Care | Payer: Medicaid Other | Attending: Internal Medicine | Admitting: Internal Medicine

## 2022-12-17 DIAGNOSIS — G43909 Migraine, unspecified, not intractable, without status migrainosus: Secondary | ICD-10-CM | POA: Insufficient documentation

## 2022-12-17 DIAGNOSIS — Z1152 Encounter for screening for COVID-19: Secondary | ICD-10-CM | POA: Insufficient documentation

## 2022-12-17 HISTORY — DX: Postural orthostatic tachycardia syndrome (POTS): G90.A

## 2022-12-17 LAB — COVID-19 (SARS-COV-2) & INFLUENZA  A/B, NAA (ROCHE LIAT)
Influenza A RNA: NOT DETECTED
Influenza B RNA: NOT DETECTED
SARS-CoV-2 (COVID-19) RNA: NOT DETECTED

## 2022-12-17 LAB — COMPREHENSIVE METABOLIC PANEL
ALT: 16 U/L (ref 0–55)
AST (SGOT): 17 U/L (ref 5–41)
Albumin/Globulin Ratio: 1.7 (ref 0.9–2.2)
Albumin: 4.3 g/dL (ref 3.5–5.0)
Alkaline Phosphatase: 67 U/L (ref 37–117)
Anion Gap: 5 (ref 5.0–15.0)
BUN: 13 mg/dL (ref 7–21)
Bilirubin, Total: 0.3 mg/dL (ref 0.2–1.2)
CO2: 28 meq/L (ref 17–29)
Calcium: 9.6 mg/dL (ref 8.5–10.5)
Chloride: 105 meq/L (ref 99–111)
Creatinine: 1 mg/dL (ref 0.4–1.0)
GFR: >60 mL/min/{1.73_m2} (ref >=60.0)
Globulin: 2.6 g/dL (ref 2.0–3.6)
Glucose: 90 mg/dL (ref 70–100)
Potassium: 3.9 meq/L (ref 3.5–5.3)
Protein, Total: 6.9 g/dL (ref 6.0–8.3)
Sodium: 138 meq/L (ref 135–145)

## 2022-12-17 LAB — LAB USE ONLY - CBC WITH DIFFERENTIAL
Absolute Basophils: 0.08 10*3/uL (ref 0.00–0.08)
Absolute Eosinophils: 0.53 10*3/uL — ABNORMAL HIGH (ref 0.00–0.44)
Absolute Immature Granulocytes: 0.03 10*3/uL (ref 0.00–0.07)
Absolute Lymphocytes: 3.88 10*3/uL — ABNORMAL HIGH (ref 0.42–3.22)
Absolute Monocytes: 1.12 10*3/uL — ABNORMAL HIGH (ref 0.21–0.85)
Absolute Neutrophils: 6.97 10*3/uL — ABNORMAL HIGH (ref 1.10–6.33)
Absolute nRBC: 0 10*3/uL (ref <=0.00)
Basophils %: 0.6 %
Eosinophils %: 4.2 %
Hematocrit: 38.7 % (ref 34.7–43.7)
Hemoglobin: 12.9 g/dL (ref 11.4–14.8)
Immature Granulocytes %: 0.2 %
Lymphocytes %: 30.8 %
MCH: 28.8 pg (ref 25.1–33.5)
MCHC: 33.3 g/dL (ref 31.5–35.8)
MCV: 86.4 fL (ref 78.0–96.0)
MPV: 9 fL (ref 8.9–12.5)
Monocytes %: 8.9 %
Neutrophils %: 55.3 %
Platelet Count: 348 10*3/uL — ABNORMAL HIGH (ref 142–346)
Preliminary Absolute Neutrophil Count: 6.97 10*3/uL — ABNORMAL HIGH (ref 1.10–6.33)
RBC: 4.48 10*6/uL (ref 3.90–5.10)
RDW: 12 % (ref 11–15)
WBC: 12.61 10*3/uL — ABNORMAL HIGH (ref 3.10–9.50)
nRBC %: 0 /100{WBCs} (ref <=0.0)

## 2022-12-17 LAB — BETA HCG QUANTITATIVE, PREGNANCY: hCG, Quantitative: <2.4 m[IU]/mL

## 2022-12-17 MED ORDER — LACTATED RINGERS IV BOLUS
1000.0000 mL | Freq: Once | INTRAVENOUS | Status: AC
Start: 2022-12-17 — End: 2022-12-18
  Administered 2022-12-18: 1000 mL via INTRAVENOUS

## 2022-12-17 MED ORDER — KETOROLAC TROMETHAMINE 30 MG/ML IJ SOLN
15.0000 mg | Freq: Once | INTRAMUSCULAR | Status: AC
Start: 2022-12-17 — End: 2022-12-17
  Administered 2022-12-17: 15 mg via INTRAVENOUS
  Filled 2022-12-17: qty 1

## 2022-12-17 MED ORDER — DIPHENHYDRAMINE HCL 50 MG/ML IJ SOLN
25.0000 mg | Freq: Once | INTRAMUSCULAR | Status: AC
Start: 2022-12-17 — End: 2022-12-17
  Administered 2022-12-17: 25 mg via INTRAVENOUS
  Filled 2022-12-17: qty 1

## 2022-12-17 MED ORDER — METOCLOPRAMIDE HCL 5 MG/ML IJ SOLN
10.0000 mg | Freq: Once | INTRAMUSCULAR | Status: AC
Start: 2022-12-17 — End: 2022-12-17
  Administered 2022-12-17: 10 mg via INTRAVENOUS
  Filled 2022-12-17: qty 2

## 2022-12-17 NOTE — ED Triage Notes (Signed)
[facility] EMERGENCY DEPARTMENT  Provider in Triage Note        Patient Name: Elaine Mason    Chief Complaint:   Chief Complaint   Patient presents with    Migraine       HPI: Elaine Mason is a 32 y.o. female, who has had a rapid medical screening evaluation initiated by myself. Presents today with migraine for the past 48 hours. States she took sumatriptan multiple times with mild intermittent relief. Tylenol at 1500. Naproxen this AM. Light sensitivity. Denies fall or fevers. Denies change in vision, speech, sensation, or strength. States it began in pattern of routine migraines but normally not this persistent despite medications. Denies sudden onset. States currently a 7/10.    Medical/Surgical/Social history: as per HPI    Vitals: BP 119/85   Pulse 89   Temp 97.8 F (36.6 C) (Oral)   Resp 19   Wt 54.4 kg   SpO2 99%     Pertinent brief exam:   Examination of area of concern: Wheelchair. No extra work of breathing. Equal chest rise and fall. Calm and cooperative during triage. NIHSS: 0.    Preliminary orders: Labs. IVF. Meds.    Symptom based preliminary diagnosis/MDM: Migraine    Patient advised to remain in the ED until further evaluation can be performed. Patient instructed to notify staff of any changes in condition while waiting.  This assessment is an initial evaluation to expedite care.

## 2022-12-17 NOTE — ED Triage Notes (Signed)
Pt has migraines reports migraine that has been going on for the past 48 hrs. Pt has hx of migraines. reports photosensitivity, nausea. Pt has taken  1000 mg Tylenol 3pm, sumatriptan 4pm

## 2022-12-18 NOTE — ED Provider Notes (Signed)
EMERGENCY DEPARTMENT HISTORY AND PHYSICAL EXAM     Event Date/Time    Physician/Midlevel provider first contact with patient: 12/17/22 2152         Date: 12/17/2022  Patient Name: Elaine Mason    History of Presenting Illness     Chief Complaint   Patient presents with   . Migraine       History Provided By: pt and companion    Chief Complaint:   Onset:   Timing:   Location:   Quality:   Severity:   Modifying Factors:   Associated Symptoms:     Additional History: Elaine Mason is a 32 y.o. female with a h/o migraines, POTS presents with a migraine headache for the past two days. She has tried multiple doses of sumatriptan with some relief. She also took tylenol and naproxyn today. No fevers or cold symptoms, numbness or focal weakness, changes in vision or speech. She does not take prophylactic migraines medicine or see a neurologist. She has been getting migraines about once a week.     PCP: Pcp, None, MD      Current Medications[1]    Past History     Past Medical History:  Past Medical History:   Diagnosis Date   . Postural orthostatic tachycardia syndrome        Past Surgical History:  History reviewed. No pertinent surgical history.    Family History:  History reviewed. No pertinent family history.    Social History:  Social History[2]    Allergies:  Allergies[3]    Review of Systems   Review of Systems     Physical Exam   BP 103/65   Pulse 72   Temp 97.9 F (36.6 C)   Resp 19   Wt 54.4 kg   SpO2 98%   Physical Exam  Vitals reviewed.   Constitutional:       Appearance: Normal appearance. She is not toxic-appearing or diaphoretic.   HENT:      Head: Normocephalic and atraumatic.      Nose: No congestion.   Eyes:      Extraocular Movements: Extraocular movements intact.      Conjunctiva/sclera: Conjunctivae normal.   Cardiovascular:      Rate and Rhythm: Normal rate and regular rhythm.   Pulmonary:      Effort: Pulmonary effort is normal.      Breath sounds: Normal breath sounds.   Abdominal:       General: Bowel sounds are normal.      Palpations: Abdomen is soft.      Tenderness: There is no abdominal tenderness.   Musculoskeletal:      Cervical back: No rigidity.      Right lower leg: No edema.      Left lower leg: No edema.   Skin:     General: Skin is warm and dry.   Neurological:      Mental Status: She is alert and oriented to person, place, and time.      Cranial Nerves: No cranial nerve deficit.      Sensory: No sensory deficit.      Motor: No weakness.   Psychiatric:         Mood and Affect: Mood normal.         Behavior: Behavior normal.         Thought Content: Thought content normal.         Judgment: Judgment normal.  Diagnostic Study Results     Labs -     Results       Procedure Component Value Units Date/Time    Comprehensive Metabolic Panel [[id number]]  (Normal) Collected: 12/17/22 2201    Specimen: Blood, Venous Updated: 12/17/22 2300     Glucose 90 mg/dL      BUN 13 mg/dL      Creatinine 1.0 mg/dL      Sodium 102 mEq/L      Potassium 3.9 mEq/L      Chloride 105 mEq/L      CO2 28 mEq/L      Calcium 9.6 mg/dL      Anion Gap 5.0     GFR >60.0 mL/min/1.73 m2      AST (SGOT) 17 U/L      ALT 16 U/L      Alkaline Phosphatase 67 U/L      Albumin 4.3 g/dL      Protein, Total 6.9 g/dL      Globulin 2.6 g/dL      Albumin/Globulin Ratio 1.7     Bilirubin, Total 0.3 mg/dL     Beta HCG Quantitative, Pregnancy [[id number]] Collected: 12/17/22 2201    Specimen: Blood, Venous Updated: 12/17/22 2240     hCG, Quantitative <2.4 mIU/mL     Narrative:      PREGNANCY TEST INTERPRETATION  Negative for pregnancy         <5.0 mIU/mL  Indeterminate for pregnancy   5.0 - 24.9 mIU/mL - Retest after                                48 hours (recommended) or retest                                 by alternate methodology.  Positive for pregnancy        >=25.0 mIU/mL      APPROXIMATE GESTATIONAL AGE   APPROXIMATE HCG mIU/mL  0-1 WEEKS                          <50      mIU/mL  1-2 WEEKS                        40-300      mIU/mL  2-3 WEEKS                       [phone]    mIU/mL  3-4 WEEKS                       [phone]    mIU/mL  4-8 WEEKS                     5,000-200,000 mIU/mL   8-12 WEEKS                   10,000-100,000 mIU/mL  2ND TRIMESTER                 3,000-50,000  mIU/mL  3RD TRIMESTER                 1,000-50,000  mIU/mL    This total Beta-hCG assay is approved for the testing  of pregnancy ONLY. It is not approved for  any other  uses such as tumor marker screening or tumor marker  monitoring.    Rarely, some healthy, non-pregnant women may have higher  levels of BHCG.    Post menopausal women may elicit weakly positive results due  to low BHCG levels unrelated to pregnancy.    Some rare types of tumors may produce low levels BHCG.    Ectopic pregnancy and early natural termination cannot  be distinguished from early normal pregnancy by BHCG level  alone.    COVID-19 and Influenza ([place]) (symptomatic) [[id number]]  (Normal) Collected: 12/17/22 2202    Specimen: Swab from Anterior Nares Updated: 12/17/22 2238     SARS-CoV-2 (COVID-19) RNA Not Detected     Influenza A RNA Not Detected     Influenza B RNA Not Detected    Narrative:      A result of "Detected" indicates POSITIVE for the presence of viral RNA  A result of "Not Detected" indicates NEGATIVE for the presence of viral RNA    Test performed using the Roche cobas Liat SARS-CoV-2 & Influenza A/B assay. This is a multiplex real-time RT-PCR assay for the detection of SARS-CoV-2, influenza A, and influenza B virus RNA. Viral nucleic acids may persist in vivo, independent of viability. Detection of viral nucleic acid does not imply the presence of infectious virus, or that virus nucleic acid is the cause of clinical symptoms. Negative results do not preclude SARS-CoV-2, influenza A, and/or influenza B infection and should not be used as the sole basis for diagnosis, treatment or other patient management decisions. Invalid results may be due to inhibiting substances in  the specimen and recollection should occur.     CBC with Differential (Order) [[id number]]  (Abnormal) Collected: 12/17/22 2201    Specimen: Blood, Venous Updated: 12/17/22 2216    Narrative:      The following orders were created for panel order CBC with Differential (Order).  Procedure                               Abnormality         Status                     ---------                               -----------         ------                     CBC with Differential (C.Marland KitchenMarland Kitchen[[id number]]  Abnormal            Final result                 Please view results for these tests on the individual orders.    CBC with Differential (Component) [[id number]]  (Abnormal) Collected: 12/17/22 2201    Specimen: Blood, Venous Updated: 12/17/22 2216     WBC 12.61 x10 3/uL      Hemoglobin 12.9 g/dL      Hematocrit 84.1 %      Platelet Count 348 x10 3/uL      MPV 9.0 fL      RBC 4.48 x10 6/uL      MCV 86.4 fL      MCH 28.8 pg      MCHC 33.3 g/dL      RDW 12 %  nRBC % 0.0 /100 WBC      Absolute nRBC 0.00 x10 3/uL      Preliminary Absolute Neutrophil Count 6.97 x10 3/uL      Neutrophils % 55.3 %      Lymphocytes % 30.8 %      Monocytes % 8.9 %      Eosinophils % 4.2 %      Basophils % 0.6 %      Immature Granulocytes % 0.2 %      Absolute Neutrophils 6.97 x10 3/uL      Absolute Lymphocytes 3.88 x10 3/uL      Absolute Monocytes 1.12 x10 3/uL      Absolute Eosinophils 0.53 x10 3/uL      Absolute Basophils 0.08 x10 3/uL      Absolute Immature Granulocytes 0.03 x10 3/uL             Radiologic Studies -   Radiology Results (24 Hour)       ** No results found for the last 24 hours. **        .      Medical Decision Making   I am the first provider for this patient.    Vital Signs-Reviewed the patient's vital signs.   Patient Vitals for the past 12 hrs:   BP Temp Pulse Resp   12/17/22 2356 103/65 97.9 F (36.6 C) 72 19   12/17/22 2154 119/85 97.8 F (36.6 C) 89 19       Pulse Oximetry Analysis - Normal 99% on ra      ED Course: Medical  Decision Making  Amount and/or Complexity of Data Reviewed  Independent Historian: friend  Labs: ordered. Decision-making details documented in ED Course.     Details: Covid/flu is negative, comp chem is normal, preg is negative, wbc 12.6, plat 348,     Risk  OTC drugs.  Prescription drug management.  Decision regarding hospitalization.  Risk Details: Pt was given I liter lactate ringers, iv reglan, iv toradol and iv benadryl. She says that her headache is currently 2/10 from 9/10. She feels ready for discharge and declines further medications. Companion in ed to take her home. I will give a neurology referral for out pt f/u. I recommended magnesium 400 my daily.               Primary Admitting Service:    I have discussed this case with Dr. Marland Kitchen {AdmittingService:53790} at *** (time), who accepts patient for admission and requests {Obs vs Inpatient:53791} {Dispo Unit:53792} bed.     For Surgical Admissions (or any patients with potential surgical or procedural intervention during admission): only if applicable, otherwise delete this section    Anticoagulated: {YES/NO:21936}. If yes, name of medication: ***  Last PO intake: *** (date/time)  Current NPO status: *** (e.g. NPO now, NPO after midnight, etc)    Consultant(s): (if applicable, otherwise delete this section)    I have discussed this case with consultant Dr. Marland Kitchen (name and specialty) at *** (time).   Recommendations were as follows: ***                  Provider Notes: ***    Procedures:    Core Measures:    Critical Care Time:     Diagnosis     Clinical Impression: No diagnosis found.    _______________________________    Attestations:  This note is prepared by Avanell Shackleton, MD.     Avanell Shackleton, MD.  I  confirm that the note above accurately reflects all work, treatment, procedures, and medical decision making performed by me.    _______________________________           [1]   Current Facility-Administered Medications   Medication Dose Route Frequency Provider  Last Rate Last Admin   . lactated ringers bolus 1,000 mL  1,000 mL Intravenous Once Lendon Ka, FNP 999 mL/hr at 12/18/22 0008 1,000 mL at 12/18/22 0008     Current Outpatient Medications   Medication Sig Dispense Refill   . Advair Diskus 100-50 MCG/ACT Aerosol Pwdr, Breath Activated Inhale 1 puff into the lungs 2 (two) times daily     . amphetamine-dextroamphetamine (ADDERALL XR) 25 MG 24 hr capsule Take 1 capsule (25 mg) by mouth daily     . busPIRone (BUSPAR) 15 MG tablet 1 tablet (15 mg) 2 (two) times daily     . cyclobenzaprine (FLEXERIL) 5 MG tablet TK ONE T PO UP TO Q 8 H PRN. START WITH 1 T PO HS PRN DUE TO SEDATION     . Desvenlafaxine ER 100 MG Tablet SR 24 hr Take 1 tablet (100 mg) by mouth daily For 90 days     . estradiol (ESTRACE) 2 MG tablet Take 1.5 tablets (3 mg) by mouth daily     . gabapentin (NEURONTIN) 300 MG capsule Take 1 capsule (300 mg) by mouth 2 (two) times daily     . hydrOXYzine (ATARAX) 50 MG tablet TAKE 1 TABLET BY MOUTH EVERY NIGHT FOR INSOMNIA     . LORazepam (ATIVAN) 1 MG tablet 2 tablets (2 mg)     . memantine (NAMENDA) 10 MG tablet Take 1 tablet (10 mg) by mouth 2 (two) times daily     . propranolol (INDERAL) 10 MG tablet Take 1 tablet (10 mg) by mouth 2 (two) times daily     . SUMAtriptan (IMITREX) 5 MG/ACT nasal spray PLEASE SEE ATTACHED FOR DETAILED DIRECTIONS     [2]   Social History  Tobacco Use   . Smoking status: Never   . Smokeless tobacco: Never   Substance Use Topics   . Alcohol use: Never   . Drug use: Never   [3]   Allergies  Allergen Reactions   . Sulfa Antibiotics

## 2022-12-18 NOTE — ED Provider Notes (Incomplete)
EMERGENCY DEPARTMENT HISTORY AND PHYSICAL EXAM     Event Date/Time    Physician/Midlevel provider first contact with patient: 12/17/22 2152         Date: 12/17/2022  Patient Name: Elaine Mason    History of Presenting Illness     Chief Complaint   Patient presents with   . Migraine       History Provided By: pt and companion    Chief Complaint:   Onset:   Timing:   Location:   Quality:   Severity:   Modifying Factors:   Associated Symptoms:     Additional History: Elaine Mason is a 32 y.o. female with a h/o migraines, POTS presents with a migraine headache for the past two days. She has tried multiple doses of sumatriptan with some relief. She also took tylenol and naproxyn today. No fevers or cold symptoms, numbness or focal weakness, changes in vision or speech. She does not take prophylactic migraines medicine or see a neurologist. She has been getting migraines about once a week.     PCP: Pcp, None, MD      Current Medications[1]    Past History     Past Medical History:  Past Medical History:   Diagnosis Date   . Postural orthostatic tachycardia syndrome        Past Surgical History:  History reviewed. No pertinent surgical history.    Family History:  History reviewed. No pertinent family history.    Social History:  Social History[2]    Allergies:  Allergies[3]    Review of Systems   Review of Systems     Physical Exam   BP 103/65   Pulse 72   Temp 97.9 F (36.6 C)   Resp 19   Wt 54.4 kg   SpO2 98%   Physical Exam  Vitals reviewed.   Constitutional:       Appearance: Normal appearance. She is not toxic-appearing or diaphoretic.   HENT:      Head: Normocephalic and atraumatic.      Nose: No congestion.   Eyes:      Extraocular Movements: Extraocular movements intact.      Conjunctiva/sclera: Conjunctivae normal.   Cardiovascular:      Rate and Rhythm: Normal rate and regular rhythm.   Pulmonary:      Effort: Pulmonary effort is normal.      Breath sounds: Normal breath sounds.   Abdominal:       General: Bowel sounds are normal.      Palpations: Abdomen is soft.      Tenderness: There is no abdominal tenderness.   Musculoskeletal:      Cervical back: No rigidity.      Right lower leg: No edema.      Left lower leg: No edema.   Skin:     General: Skin is warm and dry.   Neurological:      Mental Status: She is alert and oriented to person, place, and time.      Cranial Nerves: No cranial nerve deficit.      Sensory: No sensory deficit.      Motor: No weakness.   Psychiatric:         Mood and Affect: Mood normal.         Behavior: Behavior normal.         Thought Content: Thought content normal.         Judgment: Judgment normal.  Diagnostic Study Results     Labs -     Results       Procedure Component Value Units Date/Time    Comprehensive Metabolic Panel [[id number]]  (Normal) Collected: 12/17/22 2201    Specimen: Blood, Venous Updated: 12/17/22 2300     Glucose 90 mg/dL      BUN 13 mg/dL      Creatinine 1.0 mg/dL      Sodium 191 mEq/L      Potassium 3.9 mEq/L      Chloride 105 mEq/L      CO2 28 mEq/L      Calcium 9.6 mg/dL      Anion Gap 5.0     GFR >60.0 mL/min/1.73 m2      AST (SGOT) 17 U/L      ALT 16 U/L      Alkaline Phosphatase 67 U/L      Albumin 4.3 g/dL      Protein, Total 6.9 g/dL      Globulin 2.6 g/dL      Albumin/Globulin Ratio 1.7     Bilirubin, Total 0.3 mg/dL     Beta HCG Quantitative, Pregnancy [[id number]] Collected: 12/17/22 2201    Specimen: Blood, Venous Updated: 12/17/22 2240     hCG, Quantitative <2.4 mIU/mL     Narrative:      PREGNANCY TEST INTERPRETATION  Negative for pregnancy         <5.0 mIU/mL  Indeterminate for pregnancy   5.0 - 24.9 mIU/mL - Retest after                                48 hours (recommended) or retest                                 by alternate methodology.  Positive for pregnancy        >=25.0 mIU/mL      APPROXIMATE GESTATIONAL AGE   APPROXIMATE HCG mIU/mL  0-1 WEEKS                          <50      mIU/mL  1-2 WEEKS                        40-300      mIU/mL  2-3 WEEKS                       [phone]    mIU/mL  3-4 WEEKS                       [phone]    mIU/mL  4-8 WEEKS                     5,000-200,000 mIU/mL   8-12 WEEKS                   10,000-100,000 mIU/mL  2ND TRIMESTER                 3,000-50,000  mIU/mL  3RD TRIMESTER                 1,000-50,000  mIU/mL    This total Beta-hCG assay is approved for the testing  of pregnancy ONLY. It is not approved for  any other  uses such as tumor marker screening or tumor marker  monitoring.    Rarely, some healthy, non-pregnant women may have higher  levels of BHCG.    Post menopausal women may elicit weakly positive results due  to low BHCG levels unrelated to pregnancy.    Some rare types of tumors may produce low levels BHCG.    Ectopic pregnancy and early natural termination cannot  be distinguished from early normal pregnancy by BHCG level  alone.    COVID-19 and Influenza ([place]) (symptomatic) [[id number]]  (Normal) Collected: 12/17/22 2202    Specimen: Swab from Anterior Nares Updated: 12/17/22 2238     SARS-CoV-2 (COVID-19) RNA Not Detected     Influenza A RNA Not Detected     Influenza B RNA Not Detected    Narrative:      A result of "Detected" indicates POSITIVE for the presence of viral RNA  A result of "Not Detected" indicates NEGATIVE for the presence of viral RNA    Test performed using the Roche cobas Liat SARS-CoV-2 & Influenza A/B assay. This is a multiplex real-time RT-PCR assay for the detection of SARS-CoV-2, influenza A, and influenza B virus RNA. Viral nucleic acids may persist in vivo, independent of viability. Detection of viral nucleic acid does not imply the presence of infectious virus, or that virus nucleic acid is the cause of clinical symptoms. Negative results do not preclude SARS-CoV-2, influenza A, and/or influenza B infection and should not be used as the sole basis for diagnosis, treatment or other patient management decisions. Invalid results may be due to inhibiting substances  in the specimen and recollection should occur.     CBC with Differential (Order) [[id number]]  (Abnormal) Collected: 12/17/22 2201    Specimen: Blood, Venous Updated: 12/17/22 2216    Narrative:      The following orders were created for panel order CBC with Differential (Order).  Procedure                               Abnormality         Status                     ---------                               -----------         ------                     CBC with Differential (C.Marland KitchenMarland Kitchen[[id number]]  Abnormal            Final result                 Please view results for these tests on the individual orders.    CBC with Differential (Component) [[id number]]  (Abnormal) Collected: 12/17/22 2201    Specimen: Blood, Venous Updated: 12/17/22 2216     WBC 12.61 x10 3/uL      Hemoglobin 12.9 g/dL      Hematocrit 34.7 %      Platelet Count 348 x10 3/uL      MPV 9.0 fL      RBC 4.48 x10 6/uL      MCV 86.4 fL      MCH 28.8 pg      MCHC 33.3 g/dL      RDW 12 %  nRBC % 0.0 /100 WBC      Absolute nRBC 0.00 x10 3/uL      Preliminary Absolute Neutrophil Count 6.97 x10 3/uL      Neutrophils % 55.3 %      Lymphocytes % 30.8 %      Monocytes % 8.9 %      Eosinophils % 4.2 %      Basophils % 0.6 %      Immature Granulocytes % 0.2 %      Absolute Neutrophils 6.97 x10 3/uL      Absolute Lymphocytes 3.88 x10 3/uL      Absolute Monocytes 1.12 x10 3/uL      Absolute Eosinophils 0.53 x10 3/uL      Absolute Basophils 0.08 x10 3/uL      Absolute Immature Granulocytes 0.03 x10 3/uL             Radiologic Studies -   Radiology Results (24 Hour)       ** No results found for the last 24 hours. **        .      Medical Decision Making   I am the first provider for this patient.    Vital Signs-Reviewed the patient's vital signs.   Patient Vitals for the past 12 hrs:   BP Temp Pulse Resp   12/17/22 2356 103/65 97.9 F (36.6 C) 72 19   12/17/22 2154 119/85 97.8 F (36.6 C) 89 19       Pulse Oximetry Analysis - Normal 99% on ra      ED Course: Medical  Decision Making  Amount and/or Complexity of Data Reviewed  Independent Historian: friend  Labs: ordered. Decision-making details documented in ED Course.     Details: Covid/flu is negative, comp chem is normal, preg is negative, wbc 12.6, plat 348,     Risk  OTC drugs.  Prescription drug management.  Decision regarding hospitalization.                Primary Admitting Service:    I have discussed this case with Dr. Marland Kitchen {AdmittingService:53790} at *** (time), who accepts patient for admission and requests {Obs vs Inpatient:53791} {Dispo Unit:53792} bed.     For Surgical Admissions (or any patients with potential surgical or procedural intervention during admission): only if applicable, otherwise delete this section    Anticoagulated: {YES/NO:21936}. If yes, name of medication: ***  Last PO intake: *** (date/time)  Current NPO status: *** (e.g. NPO now, NPO after midnight, etc)    Consultant(s): (if applicable, otherwise delete this section)    I have discussed this case with consultant Dr. Marland Kitchen (name and specialty) at *** (time).   Recommendations were as follows: ***                  Provider Notes: ***    Procedures:    Core Measures:    Critical Care Time:     Diagnosis     Clinical Impression: No diagnosis found.    _______________________________    Attestations:  This note is prepared by Avanell Shackleton, MD.     Avanell Shackleton, MD.  I confirm that the note above accurately reflects all work, treatment, procedures, and medical decision making performed by me.    _______________________________           [1]   Current Facility-Administered Medications   Medication Dose Route Frequency Provider Last Rate Last Admin   . lactated ringers bolus 1,000 mL  1,000 mL Intravenous Once Lendon Ka, FNP 999 mL/hr at 12/18/22 0008 1,000 mL at 12/18/22 0008     Current Outpatient Medications   Medication Sig Dispense Refill   . Advair Diskus 100-50 MCG/ACT Aerosol Pwdr, Breath Activated Inhale 1 puff into the lungs 2 (two) times  daily     . amphetamine-dextroamphetamine (ADDERALL XR) 25 MG 24 hr capsule Take 1 capsule (25 mg) by mouth daily     . busPIRone (BUSPAR) 15 MG tablet 1 tablet (15 mg) 2 (two) times daily     . cyclobenzaprine (FLEXERIL) 5 MG tablet TK ONE T PO UP TO Q 8 H PRN. START WITH 1 T PO HS PRN DUE TO SEDATION     . Desvenlafaxine ER 100 MG Tablet SR 24 hr Take 1 tablet (100 mg) by mouth daily For 90 days     . estradiol (ESTRACE) 2 MG tablet Take 1.5 tablets (3 mg) by mouth daily     . gabapentin (NEURONTIN) 300 MG capsule Take 1 capsule (300 mg) by mouth 2 (two) times daily     . hydrOXYzine (ATARAX) 50 MG tablet TAKE 1 TABLET BY MOUTH EVERY NIGHT FOR INSOMNIA     . LORazepam (ATIVAN) 1 MG tablet 2 tablets (2 mg)     . memantine (NAMENDA) 10 MG tablet Take 1 tablet (10 mg) by mouth 2 (two) times daily     . propranolol (INDERAL) 10 MG tablet Take 1 tablet (10 mg) by mouth 2 (two) times daily     . SUMAtriptan (IMITREX) 5 MG/ACT nasal spray PLEASE SEE ATTACHED FOR DETAILED DIRECTIONS     [2]   Social History  Tobacco Use   . Smoking status: Never   . Smokeless tobacco: Never   Substance Use Topics   . Alcohol use: Never   . Drug use: Never   [3]   Allergies  Allergen Reactions   . Sulfa Antibiotics

## 2022-12-23 ENCOUNTER — Emergency Department: Payer: Medicaid Other

## 2022-12-23 ENCOUNTER — Emergency Department
Admission: EM | Admit: 2022-12-23 | Discharge: 2022-12-23 | Disposition: A | Discharge status: Home / Self Care | Payer: Medicaid Other

## 2022-12-23 DIAGNOSIS — L089 Local infection of the skin and subcutaneous tissue, unspecified: Secondary | ICD-10-CM | POA: Insufficient documentation

## 2022-12-23 DIAGNOSIS — M25531 Pain in right wrist: Secondary | ICD-10-CM | POA: Insufficient documentation

## 2022-12-23 MED ORDER — CEPHALEXIN 500 MG PO CAPS
500.0000 mg | ORAL_CAPSULE | Freq: Four times a day (QID) | ORAL | 0 refills | Status: AC
Start: 2022-12-23 — End: 2022-12-30

## 2022-12-23 NOTE — ED Triage Notes (Signed)
[facility] EMERGENCY DEPARTMENT  Provider in Triage Note        Patient Name: Elaine Mason    Chief Complaint:   Chief Complaint   Patient presents with    Wrist Pain       HPI: Elaine Mason is a 32 y.o. female, who has had a rapid medical screening evaluation initiated by myself. Pt with sensation of bumps under her right wrist at site of carpal tunnel release that she had 1 month ago. Concern about potential abscess formation. Reports decrease in ROM to site and small area of decreased sensation over wrist. No fevers, no n/v/d.     Medical/Surgical/Social history: as per HPI    Vitals: BP 108/75   Pulse 89   Temp 98.2 F (36.8 C) (Oral)   Resp 15   Ht 5\' 4"  (1.626 m)   Wt 54.4 kg   SpO2 98%   BMI 20.60 kg/m     Pertinent brief exam:   Examination of area of concern: NAD. R wrist: mild erythema at incision site. No palpable fluctuance. Intact median, ulnar, radial nerves. Intact distal sensation, radial pulse, and cap refill <2 sec    Preliminary orders: xr    Symptom based preliminary diagnosis/MDM: wrist pain    Patient advised to remain in the ED until further evaluation can be performed. Patient instructed to notify staff of any changes in condition while waiting.  This assessment is an initial evaluation to expedite care.

## 2022-12-23 NOTE — ED Triage Notes (Signed)
Elaine Mason is a 32 y.o. female who presents to ED with c/o sensation of bumps under her right wrist at site of carpal tunnel release that she had 1 month ago. Reports hx of abscess and expresses concern over potential abscess formation. Seen by surgeon. Reports decrease in ROM to site, mild redness noted and pt reports itching. No fevers, no n/v/d.     BP 108/75   Pulse 89   Temp 98.2 F (36.8 C) (Oral)   Resp 15   Ht 5\' 4"  (1.626 m)   Wt 54.4 kg   SpO2 98%   BMI 20.60 kg/m

## 2022-12-23 NOTE — EDIE (Signed)
[facility] NOTIFICATION 12/23/2022 11:40 Elaine Mason, Elaine Mason DOB: [birth year] MRN: [id number]    Criteria Met      Has Care Guidelines    Security and Safety  No Security Events were found.  ED Care Guidelines  There are currently no ED Care Guidelines for this patient. Please check your facility's medical records system.    Care Insights - Baseline Presentation / Usual State of Health [facility])   Last Updated: 12/18/22 3:43 PM [employer] [facility]   Please be advised that this individual resides in a zip code within the [facility] service area. [facility] visits are a covered benefit for [employer].? Consider referring member to [facility] for care after Emergency Department or Inpatient discharge.? Any patient felt to be at risk for repeat ED use or Readmissions can be referred.? Please consider if a delay in PCP follow up is anticipated.? [facility] can be contacted for visits by calling:??[phone] Garret Reddish - post discharge) [phone] (non-emergent).? [facility] and [facility] care are only offered in [place] [employer] localities.?      Prescription Monitoring Program  Narx Score not available at this time.    E.D. Visit Count (12 mo.)  Facility Visits   [facility] 2   Total 2   Note: Visits indicate total known visits.     Recent Emergency Department Visit Summary  Date Facility [facility] Type Diagnoses or Chief Complaint    Dec 23, 2022  [place] [facility]  [name]  [place]  Emergency      Lumps in the skin      Dec 17, 2022  [place] [facility] [place] [facility]  [name]    Emergency      Migraine, unspecified, not intractable, without status migrainosus      Migraine      migrantes        Recent Inpatient Visit Summary  No Recent Inpatient Visits were found.  Care Team  Provider Specialty Phone Fax Service Dates   Caryl Pina Case Manager/Care Coordinator [phone]  Current    Zettie Cooley Physician Assistant [phone]  Current      [facility]  This patient has registered at the [facility] Emergency Department  For more information visit: [email or web address]     PLEASE NOTE:     1.   Any care recommendations and other clinical information are provided as guidelines or for historical purposes only, and providers should exercise their own clinical judgment when providing care.    2.   You may only use this information for purposes of treatment, payment or health care operations activities, and subject to the limitations of applicable [facility] Policies.    3.   You should consult directly with the organization that provided a care guideline or other clinical history with any questions about additional information or accuracy or completeness of information provided.    ? 2024 [facility] - [email or web address]

## 2022-12-23 NOTE — ED Provider Notes (Signed)
EMERGENCY DEPARTMENT HISTORY AND PHYSICAL EXAM     None      Date: 12/23/2022   Patient Name: Elaine Mason  Attending Physician: Dorice Lamas,*   Advanced Practice Provider: Benjie Karvonen , PA    History of Presenting Illness   Chief Complaint:  Chief Complaint   Patient presents with    Wrist Pain     HPI: Elaine Mason is a pleasant 32 y.o. female who  has a past medical history of Postural orthostatic tachycardia syndrome. (per Epic records). Reports for R wrist pain x 4 days s/p carpal tunnel surgery with Dr. Liliana Cline with [facility] on 11/27/22.  Surrounding the incision, there has been an area of erythema since surgery.  3 days ago, with radiation of erythema proximally down the palmar aspect.  Has full range of motion of her fingers.  Erythema does not radiate down the fingers.  Feels a palpable "bump "at the area of erythema with tenderness to palpation.  Occasionally with tingling of the third digit radiating down to the finger from incision site.  Decreased hyperflexion and extension at the right wrist status post surgery.  Contacted Dr. Nedra Hai today who performed her most likely inflammatory changes and advised over-the-counter analgesics.  Patient has been taking ibuprofen with improvement in pain.  Also been taking Benadryl for itching over incision site. Denies weakness, pallor extremity, numbness, fevers, chills, nausea, vomiting, purulent drainage.    PCP: Pcp, None, MD  Past History   Past Medical History:  Past Medical History:   Diagnosis Date    Postural orthostatic tachycardia syndrome      Past Surgical History:  History reviewed. No pertinent surgical history.  Family/Social History:  She reports that she has never smoked. She has never used smokeless tobacco. She reports that she does not drink alcohol and does not use drugs.    History reviewed. No pertinent family history.    Allergies:  Allergies[1]    Listed Medications on Arrival:  Home Medications       Med List Status: In Progress  Set By: Frazier Richards, RN at 12/23/2022  2:33 PM              amphetamine-dextroamphetamine (ADDERALL XR) 25 MG 24 hr capsule     Take 1 capsule (25 mg) by mouth daily     busPIRone (BUSPAR) 15 MG tablet     1 tablet (15 mg) 2 (two) times daily     Desvenlafaxine ER 100 MG Tablet SR 24 hr     Take 1 tablet (100 mg) by mouth daily For 90 days     estradiol (ESTRACE) 2 MG tablet     Take 1.5 tablets (3 mg) by mouth daily     gabapentin (NEURONTIN) 300 MG capsule (Expired)     Take 1 capsule (300 mg) by mouth 2 (two) times daily     hydrOXYzine (ATARAX) 50 MG tablet     TAKE 1 TABLET BY MOUTH EVERY NIGHT FOR INSOMNIA     LORazepam (ATIVAN) 1 MG tablet     2 tablets (2 mg)     memantine (NAMENDA) 10 MG tablet     Take 1 tablet (10 mg) by mouth 2 (two) times daily     propranolol (INDERAL) 10 MG tablet     Take 1 tablet (10 mg) by mouth 2 (two) times daily     SUMAtriptan (IMITREX) 5 MG/ACT nasal spray     PLEASE SEE ATTACHED FOR DETAILED DIRECTIONS  Flagged for Removal               Advair Diskus 100-50 MCG/ACT Aerosol Pwdr, Breath Activated     Inhale 1 puff into the lungs 2 (two) times daily     cyclobenzaprine (FLEXERIL) 5 MG tablet     TK ONE T PO UP TO Q 8 H PRN. START WITH 1 T PO HS PRN DUE TO SEDATION              Physical exam   Pulse 89  BP 108/75  Resp 15  SpO2 98 %  Temp 98.2 F (36.8 C)  Vitals:    12/23/22 1146 12/23/22 1235 12/23/22 1259   BP: 108/75     Pulse: 89     Resp:  15    Temp: 98.2 F (36.8 C)     TempSrc: Oral Oral    SpO2: 98%     Weight:   54.4 kg   Height:   5\' 4"  (1.626 m)       Physical Exam  Vitals and nursing note reviewed.   Constitutional:       Appearance: Normal appearance.   Musculoskeletal:      Right wrist: Swelling (Area of induration (w/o fluctuance) proximal to incision site) and tenderness (Proximal to incision site) present. Decreased range of motion (R wrist). Normal pulse.      Left wrist: Normal.      Comments: Cap refill less than 2 seconds.  Radial  pulses intact bilaterally. Full ROM of fingers. No palpable nodules in the fingers.   Skin:     General: Skin is warm.      Capillary Refill: Capillary refill takes less than 2 seconds.   Neurological:      Mental Status: She is alert and oriented to person, place, and time.   Psychiatric:         Mood and Affect: Mood normal.         Behavior: Behavior normal.           Diagnostic Study Results   Labs -     Results       ** No results found for the last 24 hours. **            Radiologic Studies -   Radiology Results (24 Hour)       Procedure Component Value Units Date/Time    Wrist Right PA Lateral And Oblique [[id number]] Collected: 12/23/22 1335    Order Status: Completed Updated: 12/23/22 1339    Narrative:      HISTORY: Right wrist pain and sensation of bumps within the wrist. History  of carpal tunnel release surgery performed 1 month ago.    COMPARISON: None.    FINDINGS:    No acute fracture or malalignment. Joint spaces are preserved. No discrete  osseous erosions are identified. The visualized soft tissues are  unremarkable.        Impression:          1.No acute osseous abnormality.    Vassie Moment, MD  12/23/2022 1:36 PM        .        Medical Decision Making & ED Course     I reviewed the vital signs, available nursing notes, past medical history, past surgical history, family history, social history, and this visit's labs and radiology listed above.    Medications given in the ED:  ED Medication Orders (From admission, onward)  None            Medications Prescribed: (Note: any 600mg  or 800mg  ibuprofen tablets are prescription dosing, not OTC)  Discharge Prescriptions       Medication Sig Dispense Auth. Provider    cephALEXin (KEFLEX) 500 MG capsule Take 1 capsule (500 mg) by mouth 4 (four) times daily for 7 days 28 capsule ,  N, PA            Clinical Decision Support:       Vital Signs-Reviewed the patient's vital signs.   Patient Vitals for the past 12 hrs:   BP Temp Pulse Resp   12/23/22  1235 -- -- -- 15   12/23/22 1146 108/75 98.2 F (36.8 C) 89 --       ED Course:   ED Course as of 12/23/22 1903   Mon Dec 23, 2022   1610 Attempted to reach Dr. Liliana Cline with [facility]. Receptionist informed me he is in the OR all day. Receptionist informed me Luanne Bras, his assistant would give me a call. Waited for at least 30 minutes without call-back. Attempted to call again- no one picked up the phone. Pt wanted to leave instead of waiting for call. Pt reports she will make an appointment with them this week. [TV]      ED Course User Index  [TV] , Benjie Karvonen, PA       Provider Notes:    32 y.o. female presents with R wrist pain. Ddx: tendonitis, cellulitis, tenosynovitis, wound healing.     R wrist XR: no fracture, dislocation, gas. Radiologist confirms.     There is a suspicion for infection based on history and clinical exam. Attempted to reach Dr. Liliana Cline at [facility] without success to discuss plan of care. Could be solely inflammatory changes s/p surgery. No abscess to drain.     Will empirically tx with Keflex po. Advise Ibuprofen every 6 hours for pain and inflammation. Advise to call Dr. Nedra Hai to make appointment this week.     Return to ED of worsening pain, swelling, erythema, and fever.            Though other pathology possible, pt is without current signs of instability, medical urgency or emergency.  Pt well appearing at discharge.  Pt/caregiver understands possibility of progression of disease, need for continued care and assessment outpatient.  Aftercare and return precautions discussed.    Diagnosis & Disposition     Clinical Impression:   1. Right wrist pain    2. Local skin infection        Treatment Plan:   ED Disposition       ED Disposition   Discharge    Condition   --    Date/Time   Mon Dec 23, 2022  4:11 PM    Comment   Elaine Mason discharge to home/self care.    Condition at disposition: Stable                 Followup: (See discharge instructions if not listed here)     This note was  completed using dragon medical speech recognition software. Grammatical errors, random word insertions, pronoun errors, incorrect word insertion, misspellings and incomplete sentences are occasional consequences of this technology due to software limitations. If there are questions or concerns about the content of this note or information contained within the body of this dictation they should be addressed with the provider for clarification. This chart may have been  refreshed after admission or discharge, and therefore some results may not have been available for my review at the time of my involvement with the patient.  _____________________________    CHART OWNERSHIP: I,  N , PA, am the primary clinician of record.         [1]   Allergies  Allergen Reactions    Sulfa Antibiotics         ,  N, PA  12/23/22 1903

## 2022-12-30 NOTE — Progress Notes (Signed)
CARE TEAM:  Patient Care Team:  Lendon Colonel, NP as PCP - General    HISTORY OF PRESENT ILLNESS  Chief Complaint: Follow-up of the Right Hand   Age: 32 y.o.  Sex: female     Hand-dominance: right    History of present illness: Ms. Elaine Mason presents for post-op encounter 5 weeks s/p right carpal tunnel release. Patient was seen by Evern Bio, PA-C on 12/10/22, at which time she was advised to continue working on ROM exercises and scar massages with vitamin E oil. Today, she reports she still has some pain. She notes that on Friday, she began experiencing tingling again to the site.     Past Medical History:   Diagnosis Date   . Anxiety    . Asthma    . Depression    . GERD (gastroesophageal reflux disease)      Past Surgical History:   Procedure Laterality Date   . HAND SURGERY     . NO RELEVANT SURGERIES       Tobacco/Alcohol/Drugs: none  Occupation: n/a, disability     OBJECTIVE  Constitutional:  No acute distress. Her body mass index is 19.22 kg/m.   Eyes:  Sclera are nonicteric.  Respiratory:  No labored breathing.  Cardiovascular:  No marked edema.  Skin:  No marked skin ulcers.  Neurological:  No marked sensory loss noted.  Psychiatric: Alert and oriented x3.    Musculoskeletal exam:  Right upper extremity:  Carpal tunnel incision is well-healed, with some firm scar tissue in the dermis.. No signs of infection.   There is some mild swelling noted proximal to the wrist crease, extending to the ulnar aspect of the wrist.  There is no signs of erythema, no drainage from the incision.  Full finger range of motion  Limited wrist flexion and extension.  Sensation intact to light touch to median/radial/ulnar nerves  Motor intact to anterior interosseous, posterior interosseous, and ulnar nerves  Fingers warm, well-perfused  2+ radial pulse        IMAGING / STUDIES         No imaging obtained     PROCEDURES  Procedures    ASSESSMENT  1. Right carpal tunnel syndrome         Diagnoses, DOI/DOS:   s/p right carpal tunnel  release, DOS: 11/27/22    IMPRESSION/PLAN  I recommend she begin therapy as well as scar massages with vitamin E oil for symptom relief.  I reassured her that there is no clinical signs of infection seen today.  I explained to her that the swelling that she is noting proximal to the wrist crease is expected, and due to the release being performed more proximally.  Given the amount of swelling she experiences, I offered a oral course of steroids to aid in her scar formation and inflammation.  Rx for Prednisone provided and sent the patient's preferred pharmacy.   Given the stiffness she has, I also recommend she begin a course of occupational therapy.  Referral for OT provided.   Patient strongly wishes to proceed with obtaining an MRI at this time for further evaluation. Order for right wrist MRI provided.    Orders Placed This Encounter   . MRI wrist right without contrast   . Ambulatory referral to Occupational Therapy   . predniSONE (DELTASONE) 10 MG tablet        Follow-up:   Return in about 4 weeks (around 01/28/2023) for MRI review.    Adin Hector, MD,  personally performed the services described in the documentation as scribed in my presence, and it is both accurate and complete.  Scribed by: Dia Crawford

## 2023-01-05 ENCOUNTER — Emergency Department: Payer: Medicaid Other

## 2023-01-05 ENCOUNTER — Emergency Department
Admission: EM | Admit: 2023-01-05 | Discharge: 2023-01-05 | Disposition: A | Discharge status: Home / Self Care | Payer: Medicaid Other | Attending: Emergency Medicine | Admitting: Emergency Medicine

## 2023-01-05 DIAGNOSIS — M25531 Pain in right wrist: Secondary | ICD-10-CM | POA: Insufficient documentation

## 2023-01-05 LAB — COMPREHENSIVE METABOLIC PANEL
ALT: 34 U/L (ref 0–55)
AST (SGOT): 23 U/L (ref 5–41)
Albumin/Globulin Ratio: 1.4 (ref 0.9–2.2)
Albumin: 4.3 g/dL (ref 3.5–5.0)
Alkaline Phosphatase: 86 U/L (ref 37–117)
Anion Gap: 10 (ref 5.0–15.0)
BUN: 14 mg/dL (ref 7–21)
Bilirubin, Total: 0.2 mg/dL (ref 0.2–1.2)
CO2: 26 meq/L (ref 17–29)
Calcium: 9.6 mg/dL (ref 8.5–10.5)
Chloride: 104 meq/L (ref 99–111)
Creatinine: 0.8 mg/dL (ref 0.4–1.0)
GFR: >60 mL/min/{1.73_m2} (ref >=60.0)
Globulin: 3 g/dL (ref 2.0–3.6)
Glucose: 116 mg/dL — ABNORMAL HIGH (ref 70–100)
Potassium: 4 meq/L (ref 3.5–5.3)
Protein, Total: 7.3 g/dL (ref 6.0–8.3)
Sodium: 140 meq/L (ref 135–145)

## 2023-01-05 LAB — URINALYSIS WITH REFLEX TO MICROSCOPIC EXAM - REFLEX TO CULTURE
Urine Bilirubin: NEGATIVE
Urine Blood: NEGATIVE
Urine Glucose: NEGATIVE
Urine Ketones: NEGATIVE mg/dL
Urine Leukocyte Esterase: NEGATIVE
Urine Nitrite: NEGATIVE
Urine Specific Gravity: 1.021 (ref 1.001–1.035)
Urine Urobilinogen: NORMAL mg/dL (ref 0.2–2.0)
Urine pH: 6.5 (ref 5.0–8.0)

## 2023-01-05 LAB — LACTIC ACID: Whole Blood Lactic Acid: 1.5 mmol/L (ref 0.2–2.0)

## 2023-01-05 LAB — LAB USE ONLY - CBC WITH DIFFERENTIAL
Absolute Basophils: 0.02 10*3/uL (ref 0.00–0.08)
Absolute Eosinophils: 0 10*3/uL (ref 0.00–0.44)
Absolute Immature Granulocytes: 0.13 10*3/uL — ABNORMAL HIGH (ref 0.00–0.07)
Absolute Lymphocytes: 1.91 10*3/uL (ref 0.42–3.22)
Absolute Monocytes: 1.19 10*3/uL — ABNORMAL HIGH (ref 0.21–0.85)
Absolute Neutrophils: 13.48 10*3/uL — ABNORMAL HIGH (ref 1.10–6.33)
Absolute nRBC: 0 10*3/uL (ref <=0.00)
Basophils %: 0.1 %
Eosinophils %: 0 %
Hematocrit: 39.4 % (ref 34.7–43.7)
Hemoglobin: 13.2 g/dL (ref 11.4–14.8)
Immature Granulocytes %: 0.8 %
Lymphocytes %: 11.4 %
MCH: 28.9 pg (ref 25.1–33.5)
MCHC: 33.5 g/dL (ref 31.5–35.8)
MCV: 86.4 fL (ref 78.0–96.0)
MPV: 8.7 fL — ABNORMAL LOW (ref 8.9–12.5)
Monocytes %: 7.1 %
Neutrophils %: 80.6 %
Platelet Count: 460 10*3/uL — ABNORMAL HIGH (ref 142–346)
Preliminary Absolute Neutrophil Count: 13.48 10*3/uL — ABNORMAL HIGH (ref 1.10–6.33)
RBC: 4.56 10*6/uL (ref 3.90–5.10)
RDW: 12 % (ref 11–15)
WBC: 16.73 10*3/uL — ABNORMAL HIGH (ref 3.10–9.50)
nRBC %: 0 /100{WBCs} (ref <=0.0)

## 2023-01-05 LAB — COVID-19 (SARS-COV-2) & INFLUENZA  A/B, NAA (ROCHE LIAT)
Influenza A RNA: NOT DETECTED
Influenza B RNA: NOT DETECTED
SARS-CoV-2 (COVID-19) RNA: NOT DETECTED

## 2023-01-05 LAB — SEDIMENTATION RATE: Sed Rate: 1 mm/h (ref <=20)

## 2023-01-05 LAB — C-REACTIVE PROTEIN HIGH SENSITIVE: High Sensitivity C-Reactive Protein: 0.06 mg/dL (ref 0.00–1.00)

## 2023-01-05 LAB — PROCALCITONIN: Procalcitonin: <0.02 ng/mL (ref 0.00–0.10)

## 2023-01-05 MED ORDER — NALOXONE HCL 4 MG/0.1ML NA LIQD
NASAL | 0 refills | Status: DC
Start: 2023-01-05 — End: 2023-04-14

## 2023-01-05 MED ORDER — OXYCODONE HCL 5 MG PO TABS
2.5000 mg | ORAL_TABLET | Freq: Three times a day (TID) | ORAL | 0 refills | Status: DC | PRN
Start: 2023-01-05 — End: 2023-04-14

## 2023-01-05 MED ORDER — OXYCODONE-ACETAMINOPHEN 5-325 MG PO TABS
1.0000 | ORAL_TABLET | Freq: Once | ORAL | Status: AC
Start: 2023-01-05 — End: 2023-01-05
  Administered 2023-01-05: 1 via ORAL
  Filled 2023-01-05: qty 1

## 2023-01-05 NOTE — EDIE (Signed)
[facility] NOTIFICATION 01/05/2023 13:38 Elaine Mason, Elaine Mason DOB: 1990-07-29 MRN: [id number]    Criteria Met      Has Care Guidelines    Security and Safety  No Security Events were found.  ED Care Guidelines  There are currently no ED Care Guidelines for this patient. Please check your facility's medical records system.    Care Insights - Baseline Presentation / Usual State of Health [facility])   Last Updated: 12/18/22 3:43 PM [employer] [facility] [place]   Please be advised that this individual resides in a zip code within the [facility] service area. [facility] visits are a covered benefit for [employer].? Consider referring member to [facility] for care after Emergency Department or Inpatient discharge.? Any patient felt to be at risk for repeat ED use or Readmissions can be referred.? Please consider if a delay in PCP follow up is anticipated.? [facility] can be contacted for visits by calling:??[phone] Garret Reddish - post discharge) [phone] (non-emergent).? [facility] and [facility] care are only offered in [place] [employer] localities.?      Prescription Monitoring Program  Narx Score not available at this time.    E.D. Visit Count (12 mo.)  Facility Visits   [facility] 3   Total 3   Note: Visits indicate total known visits.     Recent Emergency Department Visit Summary  Date Facility [facility] Type Diagnoses or Chief Complaint    Jan 05, 2023  [facility]  [name]  [place]  Emergency      Complication from right wrist surgery      Dec 23, 2022  [facility]  [name]  [place]  Emergency      Local infection of the skin and subcutaneous tissue, unspecified      Pain in right wrist      Wrist Pain      Lumps in the skin      Dec 17, 2022  [facility] [facility]  [name]  [place]  Emergency      Migraine, unspecified, not intractable, without status migrainosus      Migraine      migrantes        Recent Inpatient Visit Summary  No  Recent Inpatient Visits were found.  Care Team  Provider Specialty Phone Fax Service Dates   Caryl Pina Case Manager/Care Coordinator [phone]  Current    Zettie Cooley Physician Assistant [phone]  Current      [facility]  This patient has registered at the [facility] Emergency Department  For more information visit: [email or web address]     PLEASE NOTE:     1.   Any care recommendations and other clinical information are provided as guidelines or for historical purposes only, and providers should exercise their own clinical judgment when providing care.    2.   You may only use this information for purposes of treatment, payment or health care operations activities, and subject to the limitations of applicable [facility] Policies.    3.   You should consult directly with the organization that provided a care guideline or other clinical history with any questions about additional information or accuracy or completeness of information provided.    ? 2024 [facility] - [email or web address]

## 2023-01-05 NOTE — Discharge Instructions (Signed)
Follow up with your orthopedic surgeon and obtain MRI of the R wrist as soon as possible. Return to ED for severe swelling, inability to move the R wrist, severe pain, fever (Temperature over 100.1), vomiting, or any other concerns.

## 2023-01-05 NOTE — ED Provider Notes (Signed)
EMERGENCY DEPARTMENT HISTORY AND PHYSICAL EXAM     Event Date/Time    Physician/Midlevel provider first contact with patient: 01/05/23 1409         Date: 01/05/2023  Patient Name: Elaine Mason    History of Presenting Illness     Chief Complaint   Patient presents with    Wrist Pain    Chills           History: Please see medical decision making section below for detailed history of presenting illness.      PCP: Pcp, None, MD  SPECIALISTS:    Current Medications[1]    Past History     Past Medical History:  Past Medical History:   Diagnosis Date    Postural orthostatic tachycardia syndrome        Past Surgical History:  Past Surgical History[2]    Family History:  Family History[3]    Social History:  Social History[4]    Allergies:  Allergies[5]    Review of Systems     Review of Systems: All pertinent systems reviewed and negative.         Physical Exam   BP 125/85   Pulse 77   Temp 98.3 F (36.8 C) (Oral)   Resp 20   Ht 5\' 4"  (1.626 m)   Wt 54.4 kg   SpO2 98%   BMI 20.60 kg/m     Constitutional: Vital signs reviewed. Well appearing. No distress.  Head: Normocephalic, atraumatic  Eyes: Conjunctiva and sclera are normal.  No injection or discharge.  Ears, Nose, Throat:  Normal external examination of the nose and ears. No throat or oropharyngeal swelling or erythema. Midline uvula. Mucous membranes moist.  Neck: Normal range of motion. Supple, no meningeal signs. Trachea midline. No stridor. No JVD  Respiratory/Chest: Clear to auscultation. No respiratory distress.   Cardiovascular: Regular rate and rhythm. No murmurs.  Abdomen:  Bowel sounds intact. No rebound or guarding. Soft.  Non-tender.  Back: No CVA tenderness to percussion. No focal tenderness.  Upper Extremity: Mild swelling and tenderness noted to the ventral aspect of the right wrist.  Mildly decreased range of motion in the right wrist.  No erythema.  No edema. No cyanosis. Bilateral radial pulses intact and equal.   Lower Extremity:  No  edema. No cyanosis. Bilateral calves symmetrical and non-tender.    Skin: Warm and dry. No rash.  Neuro: Cranial nerves grossly intact.  Moves all extremities spontaneously.  Psychiatric: Normal affect.  Normal insight.      Diagnostic Study Results     Labs -     Results       Procedure Component Value Units Date/Time    Sedimentation Rate (ESR) [[id number]]  (Normal) Collected: 01/05/23 1420    Specimen: Blood, Venous Updated: 01/05/23 1944     Sed Rate 1 mm/Hr     C-Reactive Protein, High Sensitive [[id number]]  (Normal) Collected: 01/05/23 1732    Specimen: Blood, Venous Updated: 01/05/23 1943     High Sensitivity C-Reactive Protein 0.06 mg/dL     NWGNF-62 (SARS-CoV-2) and Influenza A/B, NAA (Liat Rapid)- Admission [[id number]]  (Normal) Collected: 01/05/23 1731    Specimen: Swab from Anterior Nares Updated: 01/05/23 1841     SARS-CoV-2 (COVID-19) RNA Not Detected     Influenza A RNA Not Detected     Influenza B RNA Not Detected    Narrative:      A result of "Detected" indicates POSITIVE for the presence of viral RNA  A result of "Not Detected" indicates NEGATIVE for the presence of viral RNA    Test performed using the Roche cobas Liat SARS-CoV-2 & Influenza A/B assay. This is a multiplex real-time RT-PCR assay for the detection of SARS-CoV-2, influenza A, and influenza B virus RNA. Viral nucleic acids may persist in vivo, independent of viability. Detection of viral nucleic acid does not imply the presence of infectious virus, or that virus nucleic acid is the cause of clinical symptoms. Negative results do not preclude SARS-CoV-2, influenza A, and/or influenza B infection and should not be used as the sole basis for diagnosis, treatment or other patient management decisions. Invalid results may be due to inhibiting substances in the specimen and recollection should occur.     Urinalysis with Reflex to Microscopic Exam and Culture [[id number]]  (Abnormal) Collected: 01/05/23 1732    Specimen: Urine, Clean Catch  Updated: 01/05/23 1815     Urine Color Yellow     Urine Clarity Hazy     Urine Specific Gravity 1.021     Urine pH 6.5     Urine Leukocyte Esterase Negative     Urine Nitrite Negative     Urine Protein 10= Trace     Urine Glucose Negative     Urine Ketones Negative mg/dL      Urine Urobilinogen Normal mg/dL      Urine Bilirubin Negative     Urine Blood Negative     RBC, UA 3-5 /hpf      Urine WBC 0-5 /hpf      Urine Squamous Epithelial Cells 0-5 /hpf      Urine Mucus Present    Procalcitonin [[id number]]  (Normal) Collected: 01/05/23 1420    Specimen: Blood, Venous Updated: 01/05/23 1814     Procalcitonin <0.02 ng/ml     Lactic Acid [[id number]]  (Normal) Collected: 01/05/23 1732    Specimen: Blood, Venous Updated: 01/05/23 1755     Whole Blood Lactic Acid 1.5 mmol/L     Comprehensive Metabolic Panel [[id number]]  (Abnormal) Collected: 01/05/23 1420    Specimen: Blood, Venous Updated: 01/05/23 1442     Glucose 116 mg/dL      BUN 14 mg/dL      Creatinine 0.8 mg/dL      Sodium 629 mEq/L      Potassium 4.0 mEq/L      Chloride 104 mEq/L      CO2 26 mEq/L      Calcium 9.6 mg/dL      Anion Gap 52.8     GFR >60.0 mL/min/1.73 m2      AST (SGOT) 23 U/L      ALT 34 U/L      Alkaline Phosphatase 86 U/L      Albumin 4.3 g/dL      Protein, Total 7.3 g/dL      Globulin 3.0 g/dL      Albumin/Globulin Ratio 1.4     Bilirubin, Total 0.2 mg/dL     CBC with Differential (Order) [[id number]]  (Abnormal) Collected: 01/05/23 1420    Specimen: Blood, Venous Updated: 01/05/23 1428    Narrative:      The following orders were created for panel order CBC with Differential (Order).  Procedure                               Abnormality         Status                     ---------                               -----------         ------  CBC with Differential (C.Marland KitchenMarland Kitchen[[id number]]  Abnormal            Final result                 Please view results for these tests on the individual orders.    CBC with Differential (Component)  [[id number]]  (Abnormal) Collected: 01/05/23 1420    Specimen: Blood, Venous Updated: 01/05/23 1428     WBC 16.73 x10 3/uL      Hemoglobin 13.2 g/dL      Hematocrit 95.1 %      Platelet Count 460 x10 3/uL      MPV 8.7 fL      RBC 4.56 x10 6/uL      MCV 86.4 fL      MCH 28.9 pg      MCHC 33.5 g/dL      RDW 12 %      nRBC % 0.0 /100 WBC      Absolute nRBC 0.00 x10 3/uL      Preliminary Absolute Neutrophil Count 13.48 x10 3/uL      Neutrophils % 80.6 %      Lymphocytes % 11.4 %      Monocytes % 7.1 %      Eosinophils % 0.0 %      Basophils % 0.1 %      Immature Granulocytes % 0.8 %      Absolute Neutrophils 13.48 x10 3/uL      Absolute Lymphocytes 1.91 x10 3/uL      Absolute Monocytes 1.19 x10 3/uL      Absolute Eosinophils 0.00 x10 3/uL      Absolute Basophils 0.02 x10 3/uL      Absolute Immature Granulocytes 0.13 x10 3/uL             Radiologic Studies -   Radiology Results (24 Hour)       Procedure Component Value Units Date/Time    CT Wrist Right without Contrast [[id number]] Collected: 01/05/23 1646    Order Status: Completed Updated: 01/05/23 1654    Narrative:      HISTORY: Right wrist pain and swelling. History of carpal tunnel release in  August and a subsequent fall.    COMPARISON: Right wrist x-rays of 12/23/2022.    TECHNIQUE: CT of the right wrist performed without intravenous contrast.  Multiplanar reformatted images were created and reviewed. The following  dose reduction techniques were utilized: automated exposure control and/or  adjustment of the mA and/or KV according to patient size, and the use of an  iterative reconstruction technique.    CONTRAST: None.    FINDINGS:  Bones: There is no evidence of a fracture or other bony abnormality. No  erosions or lucencies are seen. The joint spaces are intact.    Soft Tissues: There are surgical changes compatible with recent carpal  tunnel release. There is edema and/or mild fluid in the surgical bed and  around the flexor tendons in the carpal tunnel. There  is no evidence of  tendon rupture. There is no soft tissue gas. The intrinsic muscles are  unremarkable.      Impression:        1.Surgical changes compatible with recent right carpal tunnel release.  There is edema and a mild fluid in the surgical bed and around the flexor  tendons in the carpal tunnel. Soft tissue evaluation could be better  performed by MRI of the wrist with and without contrast as clinically  needed.  2.No bony  abnormality.    Lanier Ensign, MD  01/05/2023 4:52 PM        .    Medical Decision Making   I am the first provider for this patient.    I reviewed the vital signs, available nursing notes, medical records, past medical history, past surgical history, family history and social history.    Vital Signs-Reviewed the patient's vital signs.   No data found.                   ED Course:     720 -discussed case with Dr. Nedra Hai, patient's surgeon.  Requests ESR and CRP be added to labs.  He has a low clinical suspicion for infection and suspects patient's symptoms are within the normal course of postsurgical symptoms.  He has already ordered MRI of the wrist for outpatient.  Will follow-up with patient in clinic.    736 - Discussed results with pt and counseled on diagnosis, f/u plans, medication use, supportive care, and signs and symptoms when to return to ED immediately. Pt voices understanding and agreement with plan. All questions and concerns addressed.         Provider Notes:    History provided by patient.     Pt's severe chronic medical history of POTS taken into account when formulating plan for evaluation and treatment of acute presentation today.    Old medical records reviewed: [place] PMP records reviewed: Patient has active prescriptions for lorazepam and Adderall.  No active opiate prescriptions.    Zayneb Baucum is a 32 y.o. female presenting to the ED with moderate severity, acute pain and swelling in the right wrist for the past 2 weeks.  Patient had carpal tunnel release done by  Dr. Nedra Hai from [facility] [place] 8/21.  She has had persistent pain and swelling in the right wrist since then.  She was started on steroids a few days ago by Dr. Nedra Hai without improvement in symptoms.  Outpatient MRI has been ordered for further evaluation of patient's right wrist symptoms.  Patient concerned for infection in the right wrist.      Imaging and labs results, interpreted by me, the EP, where applicable: Elevated WBC 16 noted, likely secondary to recently initiated steroids.  ESR and CRP normal.  Normal CMP.  CT of the right wrist shows mild edema and fluid in the carpal tunnel, likely a sequelae of surgery.    I have considered but have low clinical suspicion for septic right wrist, bacterial sepsis, compartment syndrome, osteomyelitis, or other acutely life threatening etiology of symptoms or surgical emergency.     I have considered admitting patient for further evaluation and management of symptoms, MRI, Ortho consult. However, given reassuring evaluation in ED, pt  preferred discharge home to hospitalization. Shared decision making between patient, patient's orthopedic surgeon Dr. Nedra Hai, and myself utilized to reach this decision.     Will give patient short prescription for oxycodone for home given she states that Tylenol and Motrin are not controlling her pain.  Reviewed that she is currently taking lorazepam which can have synergistic effects with opiate pain medications advised patient not to take those medications together.  I will also prescribe Narcan for home.      Reviewed supportive care and immediate return precautions.           Social determinants of health: pt given positive reinforcement for avoidance of tobacco and excessive alcohol           Diagnosis  Clinical Impression:   1. Right wrist pain        Treatment Plan:   ED Disposition       ED Disposition   Discharge    Condition   --    Date/Time   Sun Jan 05, 2023  7:36 PM    Comment   Nijae Doyel discharge to home/self  care.    Condition at disposition: Stable                   _______________________________    This note was generated by the Epic EMR system/ Dragon speech recognition and may contain inherent errors or omissions not intended by the user. Grammatical errors, random word insertions, deletions and pronoun errors  are occasional consequences of this technology due to software limitations. Not all errors are caught or corrected. If there are questions or concerns about the content of this note or information contained within the body of this dictation they should be addressed directly with the author for clarification.      Attestations: This note is prepared by Lynnea Ferrier, MD    _______________________________         [1]   No current facility-administered medications for this encounter.     Current Outpatient Medications   Medication Sig Dispense Refill    Advair Diskus 100-50 MCG/ACT Aerosol Pwdr, Breath Activated Inhale 1 puff into the lungs 2 (two) times daily      amphetamine-dextroamphetamine (ADDERALL XR) 25 MG 24 hr capsule Take 1 capsule (25 mg) by mouth daily      busPIRone (BUSPAR) 15 MG tablet 1 tablet (15 mg) 2 (two) times daily      cyclobenzaprine (FLEXERIL) 5 MG tablet TK ONE T PO UP TO Q 8 H PRN. START WITH 1 T PO HS PRN DUE TO SEDATION      Desvenlafaxine ER 100 MG Tablet SR 24 hr Take 1 tablet (100 mg) by mouth daily For 90 days      estradiol (ESTRACE) 2 MG tablet Take 1.5 tablets (3 mg) by mouth daily      gabapentin (NEURONTIN) 300 MG capsule Take 1 capsule (300 mg) by mouth 2 (two) times daily      hydrOXYzine (ATARAX) 50 MG tablet TAKE 1 TABLET BY MOUTH EVERY NIGHT FOR INSOMNIA      LORazepam (ATIVAN) 1 MG tablet 2 tablets (2 mg)      memantine (NAMENDA) 10 MG tablet Take 1 tablet (10 mg) by mouth 2 (two) times daily      naloxone (NARCAN) 4 MG/0.1ML nasal spray 1 spray intranasally. If pt does not respond or relapses into respiratory depression call 911. Give additional doses every 2-3 min. 2  each 0    oxyCODONE (ROXICODONE) 5 MG immediate release tablet Take 0.5 tablets (2.5 mg) by mouth 3 (three) times daily as needed for Pain 5 tablet 0    propranolol (INDERAL) 10 MG tablet Take 1 tablet (10 mg) by mouth 2 (two) times daily      SUMAtriptan (IMITREX) 5 MG/ACT nasal spray PLEASE SEE ATTACHED FOR DETAILED DIRECTIONS     [2]   Past Surgical History:  Procedure Laterality Date    CARPAL TUNNEL RELEASE  11/27/2022    RELEASE, CUBITAL TUNNEL Right 06/2021   [3] No family history on file.  [4]   Social History  Tobacco Use    Smoking status: Never    Smokeless tobacco: Never   Vaping Use    Vaping status:  Never Used   Substance Use Topics    Alcohol use: Never    Drug use: Never   [5]   Allergies  Allergen Reactions    Sulfa Antibiotics         Maryella Shivers, MD  01/09/23 2224

## 2023-01-05 NOTE — ED Triage Notes (Signed)
Pt came in from home in a wheelchair, A&Ox4, c/o worsening pain in R wrist after pt's wrist surgery 11/27/22. Pt states she feels a hard nodule/scar tissue growing within the surgical incision cause nerve pain. Pt states she has been having chills for the past few days. Pt reports an episode of falling onto her R wrist 3 wks after the surgery.

## 2023-01-05 NOTE — ED Notes (Signed)
Pt states she had chills this morning with a temp of 99.34F

## 2023-01-05 NOTE — ED Triage Notes (Signed)
[facility] EMERGENCY DEPARTMENT  Provider in Triage Note        Patient Name: Elaine Mason    Chief Complaint:   Chief Complaint   Patient presents with    Wrist Pain    Chills       HPI: Merced Brougham is a 32 y.o. female, who has had a rapid medical screening evaluation initiated by myself. Presents today with concern for worsening right wrist pain since having surgery for carpal tunnel one month ago. Reports fall after surgery. Denies change in sensation/strength in right hand outside of intermittent shooting pain. Gets around in wheelchair so wrist with constant activity and pain worsening. Denies fevers but reports body aches/chills. States she is concerned for abscess because she has had similar presentations in the past. Met with surgeon on Tuesday who said it was probably scar tissue.    Medical/Surgical/Social history: as per HPI    Vitals: BP 108/74   Pulse 93   Temp 98.6 F (37 C) (Oral)   Resp 16   Ht 5\' 4"  (1.626 m)   Wt 54.4 kg   SpO2 97%   BMI 20.60 kg/m     Pertinent brief exam:   Examination of area of concern: Wheelchair (from home). No extra work of breathing. Equal chest rise and fall. Calm and cooperative during triage. TTP to right wrist with swelling. U/M/R nerve sensation/strength intact. Brisk cap refill.    Preliminary orders: Labs. CT wrist.    Symptom based preliminary diagnosis/MDM: Right Wrist Pain (post surgical)    Patient advised to remain in the ED until further evaluation can be performed. Patient instructed to notify staff of any changes in condition while waiting.  This assessment is an initial evaluation to expedite care.

## 2023-01-06 ENCOUNTER — Other Ambulatory Visit: Payer: Self-pay | Admitting: Student in an Organized Health Care Education/Training Program

## 2023-01-06 DIAGNOSIS — M25531 Pain in right wrist: Secondary | ICD-10-CM

## 2023-01-06 LAB — LAB USE ONLY - URINE GRAY CULTURE HOLD TUBE

## 2023-01-08 ENCOUNTER — Other Ambulatory Visit: Payer: Self-pay | Admitting: Student in an Organized Health Care Education/Training Program

## 2023-01-08 ENCOUNTER — Ambulatory Visit
Admission: RE | Admit: 2023-01-08 | Discharge: 2023-01-08 | Disposition: A | Discharge status: Home / Self Care | Payer: Medicaid Other | Source: Ambulatory Visit | Attending: Student in an Organized Health Care Education/Training Program | Admitting: Student in an Organized Health Care Education/Training Program

## 2023-01-08 DIAGNOSIS — G5601 Carpal tunnel syndrome, right upper limb: Secondary | ICD-10-CM | POA: Insufficient documentation

## 2023-01-08 DIAGNOSIS — M25531 Pain in right wrist: Secondary | ICD-10-CM | POA: Insufficient documentation

## 2023-04-11 ENCOUNTER — Telehealth: Payer: Self-pay | Admitting: No Specialty

## 2023-04-11 NOTE — Telephone Encounter (Signed)
[place] neurolog[facility] v/m was full. Due to inclement weather visit changed to video

## 2023-04-14 ENCOUNTER — Encounter: Payer: Self-pay | Admitting: No Specialty

## 2023-04-14 ENCOUNTER — Ambulatory Visit: Payer: Medicaid Other | Attending: No Specialty | Admitting: No Specialty

## 2023-04-14 DIAGNOSIS — F32A Depression, unspecified: Secondary | ICD-10-CM

## 2023-04-14 DIAGNOSIS — G43009 Migraine without aura, not intractable, without status migrainosus: Secondary | ICD-10-CM

## 2023-04-14 DIAGNOSIS — F419 Anxiety disorder, unspecified: Secondary | ICD-10-CM

## 2023-04-14 DIAGNOSIS — Z79899 Other long term (current) drug therapy: Secondary | ICD-10-CM

## 2023-04-14 MED ORDER — RIZATRIPTAN BENZOATE 10 MG PO TBDP
ORAL_TABLET | ORAL | 2 refills | Status: DC
Start: 2023-04-14 — End: 2023-07-25

## 2023-04-14 NOTE — Patient Instructions (Addendum)
Our plan:     Rizatriptan  Take 1 tab at headache onset. May repeat once as needed in 2 hours.Do not take more than 2 tabs in 24 hours.Do not take more than 3 time a week    Follow the SEEDS for success in headache management, including Sleep hygiene, Exercising regularly, Eating healthy and regular meals, Drinking water, keeping a headache Diary, and Stress reduction.  Limit acute medication use to no more than 3 days per week          Today's Visit:      In today's visit, I reviewed your medications and records relating your health - prior testing, blood work, reports of other health care providers present in your electronic medical record.     If you have pertinent records from any non-[place] doctors that you would like to review, please have them sent to us or bring to them to your next office visit.     A copy of today's visit will be sent to your referring doctor and/or primary care doctor, if you have one listed in our system.    Let me know if there are things we could have done better for your office visit.    Patient satisfaction survey:      If you receive a patient satisfaction survey, I would greatly appreciate it if you would complete it. We value your feedback.     Contact me online:      Patient Portal online - Please sign up for MyChart -- this is the best way to communicate with your team here.  There is a messaging feature, where you can us messages anytime of day.  It is the best way to communicate with us and get test results, medication refills, or ask questions.     You can expect to get a response within 24-48 hours during weekdays.  If you do not receive a timely response, resend your request and inform us. My goal is for every question answered ever day.  Average response for a phone call maybe 3 days due to the volume we receive (which is why MyChart is preferred).    If you are having a medical emergency -- call 911, DO NOT SEND A MESSAGE THROUGH MYCHART.     Coupons for medication:       If you have any trouble affording your medications, check out www.goodrx.com for coupons and competitive prices in your area.  If you need further assistance, let us know so we can work with you and your insurance to make sure you get the best care.    Thank you for trusting me with your health.      Take care,  Abdurahman Rugg, MD  [facility]   [facility]: [phone]  [place]: [phone]

## 2023-04-14 NOTE — Progress Notes (Signed)
 04/14/2023    Verbal consent has been obtained from the patient to conduct a telemedicine visit encounter to minimize exposure to COVID-19: yes.    A two-way, synchronous, real- time, audio and visual interactive communication system between the patient and myself was utilized.     CC: migraine    History was obtained from patient   History of Present Illness:  Elaine Mason is a 33 y.o. female who p/w migraine.     Headache hx: since age 27  Location:right peri-orbital or left  Described as thorbbing  Occurs:2-3 per month or so  Duration:>4 hours  Intensity: moderate  Aura:none  Associated with photo/phonophobia, nausea  Denies focal motor/sensory or visual loss  No positional or autonomic symptoms  Exacerbating factors:? stress  Alleviating factors:sumatriptan nasal spray sometimes works.   Sleep:OK  Mood:Stress present    Previous Preventive Medications:  Propranolol: ineffective  Gabapentin : ineffective  Cannot take TCAs due to medictaion interaction      Previous Abortive Medications:  Sumatriptan incompletely effective  Sumatriptan tab: SE      Records Reviewed:  ED notes for wrist pain in Sept 2024    Past Medical History:  Past Medical History:   Diagnosis Date    Postural orthostatic tachycardia syndrome    Anxiety/depression  ADHD  Asthma    Past Surgical History:  Past Surgical History[1]    Allergies:  Sulfa antibiotics    Family History:  +mom with migraine  No other family history related to current complaints.    Social History:  Social History[2]    Medications:  Current Medications[3]    General Exam:Respiratory rate wnl.   Gen:  Well-developed, well-nourished.  No acute distress.  Cooperative with exam.  HEENT:  NC/AT. No icterus or conjunctival hemorrhage noted.  Neck: Full range of motion.    Lungs: No audible wheezing or dyspnea at rest.   Skin:  No obvious rashes or lesions.   Extrem:  No obvious edema noted in hands.     NEUROLOGICAL EXAM    Mental Status: Awake, alert, oriented. Speech fluent  without dysarthria. Follows commands well. Attention, memory, and concentration intact. Fund of knowledge appropriate for education.     Cranial Nerves:  Extraocular movements are full. No nystagmus seen. Face is symmetric. Hearing is intact to conversational speech. Cranial nerves II-XII otherwise appears intact.     Motor: Moves all extremities well. No obvious focal weakness noted. Bulk appears normal. No tremor noted.    Sensation: Light touch intact.     Coordination: No truncal ataxia.    Gait: Deferred VV    Investigations:    Imaging:  None        Recommendations:  1. Migraine without aura and without status migrainosus, not intractable    2. Medication management    3. Anxiety and depression    Elaine Mason is a 33 y.o. female with anxiety/depression, POTS, ADHD and asthma who presents for episodic migraine without aura  No red flags on exam    Plan  Trial of rizatriptan  10mg  ODT PRN  F/up in 3 months or sooner as needed        Referring (see communications section)      The patient should seek emergent care if there is any change in the symptoms. Proper use and all side effects of medications discussed    Discussed the importance of sleep hygiene, maintaining appropriate hydration, avoid overuse of caffeine and OTC medications for headache lifestyle modification  Please contact me with any questions. Patients and Ava Providers can reach me via MyChart.    Roselie Quail, MD  [facility]  [place]: [phone]  [place]: [phone]    *DISCLAIMER: This note was generated by the Epic EMR system/ Dragon speech recognition and may contain inherent errors or omissions not intended by the user. Grammatical errors, random word insertions, deletions, pronoun errors and incomplete sentences are occasional consequences of this technology due to software limitations. Not all errors are caught or corrected. If there are questions or concerns about the content of this note or  information contained within the body of this dictation they should be addressed directly with the author for clarification.*                         [1]   Past Surgical History:  Procedure Laterality Date    CARPAL TUNNEL RELEASE  11/27/2022    RELEASE, CUBITAL TUNNEL Right 06/2021   [2]   Social History  Tobacco Use    Smoking status: Never    Smokeless tobacco: Never   Vaping Use    Vaping status: Never Used   Substance Use Topics    Alcohol use: Never    Drug use: Never   [3]   Current Outpatient Medications   Medication Sig Dispense Refill    Advair Diskus 100-50 MCG/ACT Aerosol Pwdr, Breath Activated Inhale 1 puff into the lungs 2 (two) times daily      amphetamine-dextroamphetamine (ADDERALL XR) 25 MG 24 hr capsule Take 1 capsule (25 mg) by mouth daily      busPIRone (BUSPAR) 15 MG tablet 1 tablet (15 mg) 2 (two) times daily      cyclobenzaprine (FLEXERIL) 5 MG tablet TK ONE T PO UP TO Q 8 H PRN. START WITH 1 T PO HS PRN DUE TO SEDATION      Desvenlafaxine ER 100 MG Tablet SR 24 hr Take 1 tablet (100 mg) by mouth daily For 90 days      estradiol (ESTRACE) 2 MG tablet Take 1.5 tablets (3 mg) by mouth daily      gabapentin (NEURONTIN) 300 MG capsule Take 1 capsule (300 mg) by mouth 2 (two) times daily      hydrOXYzine (ATARAX) 50 MG tablet TAKE 1 TABLET BY MOUTH EVERY NIGHT FOR INSOMNIA      LORazepam (ATIVAN) 1 MG tablet 2 tablets (2 mg)      memantine (NAMENDA) 10 MG tablet Take 1 tablet (10 mg) by mouth 2 (two) times daily      naloxone  (NARCAN ) 4 MG/0.1ML nasal spray 1 spray intranasally. If pt does not respond or relapses into respiratory depression call 911. Give additional doses every 2-3 min. 2 each 0    oxyCODONE  (ROXICODONE ) 5 MG immediate release tablet Take 0.5 tablets (2.5 mg) by mouth 3 (three) times daily as needed for Pain 5 tablet 0    propranolol (INDERAL) 10 MG tablet Take 1 tablet (10 mg) by mouth 2 (two) times daily      SUMAtriptan (IMITREX) 5 MG/ACT nasal spray PLEASE SEE ATTACHED FOR  DETAILED DIRECTIONS       No current facility-administered medications for this visit.

## 2023-04-25 ENCOUNTER — Encounter: Payer: Self-pay | Admitting: No Specialty

## 2023-04-29 ENCOUNTER — Other Ambulatory Visit: Payer: Self-pay | Admitting: No Specialty

## 2023-04-29 ENCOUNTER — Telehealth: Payer: Self-pay

## 2023-04-29 MED ORDER — GALCANEZUMAB-GNLM 120 MG/ML SC SOAJ
SUBCUTANEOUS | 11 refills | Status: DC
Start: 2023-04-29 — End: 2023-04-29

## 2023-04-29 NOTE — Telephone Encounter (Signed)
 Medication: Emgality 120mg     status: PA approved    Insurance: Medicaid HMO    (Key: [id number])    PA on file from 04/29/2023 to 04/28/2024    2/30 days $0.00 co pay    pt can fill at [facility]    not eligible for savings card

## 2023-04-29 NOTE — Progress Notes (Signed)
 Emgalirty order

## 2023-05-29 ENCOUNTER — Other Ambulatory Visit: Payer: Self-pay

## 2023-05-29 MED FILL — Galcanezumab-gnlm Subcutaneous Soln Auto-Injector 120 MG/ML: SUBCUTANEOUS | 30 days supply | Qty: 1 | Fill #0 | Status: AC

## 2023-05-30 ENCOUNTER — Other Ambulatory Visit: Payer: Self-pay

## 2023-05-31 ENCOUNTER — Other Ambulatory Visit: Payer: Self-pay

## 2023-05-31 NOTE — Progress Notes (Signed)
 Specialty Pharmacy Refill Note    Elaine Mason is a 33 y.o. female, who is being followed by [facility] team for management of: RxSp Migraine (Enrolled) for the following services:  Refill Management  Benefits and PA Management    Medications monitored by Specialty Pharmacy:   Galcanezumab -gnlm 120 MG/ML Solution Auto-injector   INJECT 2 .PENS INTO THE SKIN EVERY 30 (THIRTY) DAYS FOR 30 DAYS, THEN 1 .PEN EVERY 30 (THIRTY) DAYS.      Refill Coordination  Medications Linked to Program: Galcanezumab -gnlm  HIPAA verified (patient name & dob or patient name & address) with approved contact: Yes  HIPAA verification: please specify: Elaine SmithJuly 01, 1992  How has _______ (specialty medication) helped you manage your _______ (condition) from very well to very poor: Very Well  Changes to allergies?: No  Changes to medications, herbals or supplements?: No  New conditions (diagnosis) since last pharmacy outreach: No  Since the last fill, has the patient experienced any unplanned office visit, urgent care, emergency room, or hospital admission: No  Since the last fill, any new or worsened side effects?: No  Financial problems or insurance changes : No  Since the last fill, has the patient missed any doses of their specialty medication : No  Doses left of specialty medications: 0  Does the patient have the correct number of remaining doses: Yes  Patient confirmations: received welcome packet, patient bill of rights, & privacy practices: Patient has received all  Patient confirmation: patient previously received the drug monograph(s) for their specialty medication(s): Yes    Delivery Information  Delivery confirmation: signature required for delivery: No  Delivery method: Courier  Delivery confirmation: delivery address: Delivery Address Reviewed & Accurate  Enter delivery address: [address]   [place] [address]  Delivery phone number: [phone]  Delivery confirmation: patient informed  of and confirms delivery date. Enter Delivery date: : 05/30/23  Preferred delivery time?: Anytime  Number of medications in delivery: 1  Is there any medication that is due not being filled?: No  Supplies needed?: No supplies needed  Does patient have any concerns about safely storing medications (at the correct temperature, away from children/pets,etc.)?: No  Do any medications need mixed or dated?: No  Financial confirmation: patient informed of financial responsibility and copay amount: Yes  Financial responsibility: copay amount: $0  Questions or concerns for the pharmacist?: No  Are any medications first time fills?: No        Elaine Mason

## 2023-06-09 ENCOUNTER — Ambulatory Visit: Payer: Medicaid Other | Admitting: No Specialty

## 2023-06-24 ENCOUNTER — Other Ambulatory Visit: Payer: Self-pay

## 2023-06-24 MED FILL — Galcanezumab-gnlm Subcutaneous Soln Auto-Injector 120 MG/ML: SUBCUTANEOUS | 30 days supply | Qty: 1 | Fill #1 | Status: AC

## 2023-06-24 NOTE — Progress Notes (Signed)
 Specialty Pharmacy Refill Note    Elaine Mason is a 33 y.o. female, who is being followed by [facility]ty Pharm[facility] team for management of: RxSp Migraine (Enrolled) for the following services:  Refill Management  Benefits and PA Management    Medications monitored by Specialty Pharmacy:   Galcanezumab -gnlm 120 MG/ML Solution Auto-injector   INJECT 2 .PENS INTO THE SKIN EVERY 30 (THIRTY) DAYS FOR 30 DAYS, THEN 1 .PEN EVERY 30 (THIRTY) DAYS.      Refill Coordination  Medications Linked to Program: Galcanezumab -gnlm  HIPAA verified (patient name & dob or patient name & address) with approved contact: Yes  HIPAA verification: please specify: Elaine Mason Dec 26, 1990  How has _______ (specialty medication) helped you manage your _______ (condition) from very well to very poor: Very Well  Changes to allergies?: No  Changes to medications, herbals or supplements?: No  New conditions (diagnosis) since last pharmacy outreach: No  Since the last fill, has the patient experienced any unplanned office visit, urgent care, emergency room, or hospital admission: No  Since the last fill, any new or worsened side effects?: No  Financial problems or insurance changes : No  Since the last fill, has the patient missed any doses of their specialty medication : No  Doses left of specialty medications: 0  Does the patient have the correct number of remaining doses: Yes  Patient confirmations: received welcome packet, patient bill of rights, & privacy practices: Patient has received all  Patient confirmation: patient previously received the drug monograph(s) for their specialty medication(s): Yes    Delivery Information  Delivery confirmation: signature required for delivery: Yes  Delivery method: Courier  Delivery confirmation: delivery address: Delivery Address Reviewed & Accurate  Enter delivery address: [address]  [place] [address]  Delivery phone number: [phone]  Delivery confirmation: patient informed  of and confirms delivery date. Enter Delivery date: : 06/26/23  Preferred delivery time?: AM  Number of medications in delivery: 1  Is there any medication that is due not being filled?: No  Supplies needed?: No supplies needed  Does patient have any concerns about safely storing medications (at the correct temperature, away from children/pets,etc.)?: No  Do any medications need mixed or dated?: No  Financial confirmation: patient informed of financial responsibility and copay amount: Yes  Financial responsibility: copay amount: $0  Questions or concerns for the pharmacist?: No  Are any medications first time fills?: No        Medicare Fill? No    Elaine Mason

## 2023-06-25 ENCOUNTER — Other Ambulatory Visit: Payer: Self-pay

## 2023-07-18 ENCOUNTER — Other Ambulatory Visit: Payer: Self-pay

## 2023-07-18 MED FILL — Galcanezumab-gnlm Subcutaneous Soln Auto-Injector 120 MG/ML: SUBCUTANEOUS | 30 days supply | Qty: 1 | Fill #2 | Status: AC

## 2023-07-18 NOTE — Progress Notes (Signed)
 Specialty Pharmacy Refill Note    Elaine Mason is a 33 y.o. female, who is being followed by [facility] team for management of: RxSp Migraine (Enrolled) for the following services:  Refill Management  Benefits and PA Management    Medications monitored by Specialty Pharmacy:   Galcanezumab -gnlm 120 MG/ML Solution Auto-injector   INJECT 2 .PENS INTO THE SKIN EVERY 30 (THIRTY) DAYS FOR 30 DAYS, THEN 1 .PEN EVERY 30 (THIRTY) DAYS.      Refill Coordination  Medications Linked to Program: Galcanezumab -gnlm  HIPAA verified (patient name & dob or patient name & address) with approved contact: Yes  HIPAA verification: please specify: Elaine Mason May 04, 1990  How has _______ (specialty medication) helped you manage your _______ (condition) from very well to very poor: Very Well  Changes to allergies?: No  Changes to medications, herbals or supplements?: No  New conditions (diagnosis) since last pharmacy outreach: No  Since the last fill, has the patient experienced any unplanned office visit, urgent care, emergency room, or hospital admission: No  Since the last fill, any new or worsened side effects?: No  Financial problems or insurance changes : No  Since the last fill, has the patient missed any doses of their specialty medication : No  Doses left of specialty medications: 0  Does the patient have the correct number of remaining doses: Yes  Patient confirmations: received welcome packet, patient bill of rights, & privacy practices: Patient has received all  Patient confirmation: patient previously received the drug monograph(s) for their specialty medication(s): Yes    Delivery Information  Delivery confirmation: signature required for delivery: No  Delivery method: Courier  Delivery confirmation: delivery address: Delivery Address Reviewed & Accurate  Enter delivery address: [address]  [place] [address] (gate code: 3201)  Delivery phone number: [phone]  Delivery confirmation:  patient informed of and confirms delivery date. Enter Delivery date: : 07/22/23  Preferred delivery time?: Anytime  Number of medications in delivery: 1  Is there any medication that is due not being filled?: No  Supplies needed?: Alcohol swabs; Bandages  Does patient have any concerns about safely storing medications (at the correct temperature, away from children/pets,etc.)?: No  Do any medications need mixed or dated?: No  Financial confirmation: patient informed of financial responsibility and copay amount: Yes  Financial responsibility: copay amount: $0  Copay form of payment: Credit card on file  Questions or concerns for the pharmacist?: No  Are any medications first time fills?: No        Medicare Part B Fill? No    Elaine Mason

## 2023-07-21 ENCOUNTER — Other Ambulatory Visit: Payer: Self-pay

## 2023-07-22 ENCOUNTER — Other Ambulatory Visit: Payer: Self-pay | Admitting: No Specialty

## 2023-08-13 ENCOUNTER — Other Ambulatory Visit: Payer: Self-pay

## 2023-08-15 ENCOUNTER — Other Ambulatory Visit: Payer: Self-pay

## 2023-08-15 MED FILL — Galcanezumab-gnlm Subcutaneous Soln Auto-Injector 120 MG/ML: SUBCUTANEOUS | 30 days supply | Qty: 1 | Fill #3 | Status: CN

## 2023-08-15 NOTE — Progress Notes (Signed)
 Specialty Pharmacy Refill Note    Amarra Biondo is a 33 y.o. female, who is being followed by [facility] Specialty Pharmacy team for management of: RxSp Migraine (Enrolled) for the following services:  Refill Management  Benefits and PA Management    Medications monitored by Specialty Pharmacy:   Galcanezumab -gnlm 120 MG/ML Solution Auto-injector   INJECT 2 .PENS INTO THE SKIN EVERY 30 (THIRTY) DAYS FOR 30 DAYS, THEN 1 .PEN EVERY 30 (THIRTY) DAYS.      Refill Coordination  Medications Linked to Program: Galcanezumab -gnlm  HIPAA verified (patient name & dob or patient name & address) with approved contact: Yes  HIPAA verification: please specify: Via Refill Outreach Call  How has _______ (specialty medication) helped you manage your _______ (condition) from very well to very poor: Very Well  Changes to allergies?: No  Changes to medications, herbals or supplements?: No  New conditions (diagnosis) since last pharmacy outreach: No  Since the last fill, has the patient experienced any unplanned office visit, urgent care, emergency room, or hospital admission: No  Since the last fill, any new or worsened side effects?: No  Financial problems or insurance changes : No  Since the last fill, has the patient missed any doses of their specialty medication : No  Doses left of specialty medications: 0  Does the patient have the correct number of remaining doses: Yes  Patient confirmations: received welcome packet, patient bill of rights, & privacy practices: Patient has received all  Patient confirmation: patient previously received the drug monograph(s) for their specialty medication(s): Yes    Delivery Information  Delivery confirmation: signature required for delivery: Yes  Delivery method: Courier  Delivery confirmation: delivery address: Delivery Address Reviewed & Accurate  Enter delivery address: [address]  [place] [address] (gate code: 3201)  Delivery phone number: [phone]  Delivery  confirmation: patient informed of and confirms delivery date. Enter Delivery date: : 08/20/23  Preferred time?: Anytime  Number of medications in delivery: 1  Is there any medication that is due not being filled?: No  Supplies needed?: Alcohol swabs; Bandages  Does patient have any concerns about safely storing medications (at the correct temperature, away from children/pets,etc.)?: No  Do any medications need mixed or dated?: No  Financial confirmation: patient informed of financial responsibility and copay amount: Yes  Financial responsibility: copay amount: $0  Copay form of payment: Credit card on file  Questions or concerns for the pharmacist?: No  Are any medications first time fills?: No        Medicare Part B Fill? No    Lonni PARAS Jacon Whetzel

## 2023-08-19 ENCOUNTER — Other Ambulatory Visit: Payer: Self-pay

## 2023-08-19 MED FILL — Galcanezumab-gnlm Subcutaneous Soln Auto-Injector 120 MG/ML: SUBCUTANEOUS | 30 days supply | Qty: 1 | Fill #3 | Status: AC

## 2023-08-20 ENCOUNTER — Other Ambulatory Visit: Payer: Self-pay

## 2023-09-16 ENCOUNTER — Other Ambulatory Visit: Payer: Self-pay

## 2023-09-16 MED FILL — Galcanezumab-gnlm Subcutaneous Soln Auto-Injector 120 MG/ML: SUBCUTANEOUS | 30 days supply | Qty: 1 | Fill #4 | Status: AC

## 2023-09-16 NOTE — Progress Notes (Signed)
 Specialty Pharmacy Refill Note    Elaine Mason is a 33 y.o. female, who is being followed by [facility] Pharm[facility] team for management of: RxSp Migraine (Enrolled) for the following services:  Refill Management  Benefits and PA Management    Medications monitored by Specialty Pharmacy:   Galcanezumab -gnlm 120 MG/ML Solution Auto-injector   INJECT 2 .PENS INTO THE SKIN EVERY 30 (THIRTY) DAYS FOR 30 DAYS, THEN 1 .PEN EVERY 30 (THIRTY) DAYS.      Refill Coordination  Medications Linked to Program: Galcanezumab -gnlm  HIPAA verified (patient name & dob or patient name & address) with approved contact: Yes  HIPAA verification: please specify: Elaine Mason 1990-07-28  How has _______ (specialty medication) helped you manage your _______ (condition) from very well to very poor: Very Well  Changes to allergies?: No  Changes to medications, herbals or supplements?: No  New conditions (diagnosis) since last pharmacy outreach: No  Since the last fill, has the patient experienced any unplanned office visit, urgent care, emergency room, or hospital admission: No  Since the last fill, any new or worsened side effects?: No  Financial problems or insurance changes : No  Since the last fill, has the patient missed any doses of their specialty medication : No  Doses left of specialty medications: 0  Does the patient have the correct number of remaining doses: Yes  Patient confirmations: received welcome packet, patient bill of rights, & privacy practices: Patient has received all  Patient confirmation: patient previously received the drug monograph(s) for their specialty medication(s): Yes    Delivery Information  Delivery confirmation: signature required for delivery: Yes  Delivery method: Courier  Delivery confirmation: delivery address: Delivery Address Reviewed & Accurate  Enter delivery address: [address] [place]  [address] (gate code: 3201)  Delivery phone number: [phone]  Delivery confirmation:  patient informed of and confirms delivery date. Enter Delivery date: : 09/18/23  Preferred time?: Anytime  Number of medications in delivery: 1  Is there any medication that is due not being filled?: No  Supplies needed?: Alcohol swabs; Bandages  Does patient have any concerns about safely storing medications (at the correct temperature, away from children/pets,etc.)?: No  Do any medications need mixed or dated?: No  Financial confirmation: patient informed of financial responsibility and copay amount: Yes  Financial responsibility: copay amount: $0  Copay form of payment: Credit card on file  Questions or concerns for the pharmacist?: No  Are any medications first time fills?: No  Additional assessment notes : Dispensing:Emgality  1/30        Medicare Part B Fill? No    Elaine Mason

## 2023-09-17 ENCOUNTER — Other Ambulatory Visit: Payer: Self-pay

## 2023-10-09 ENCOUNTER — Other Ambulatory Visit: Payer: Self-pay

## 2023-10-14 ENCOUNTER — Other Ambulatory Visit: Payer: Self-pay

## 2023-10-16 ENCOUNTER — Other Ambulatory Visit: Payer: Self-pay

## 2023-10-16 MED FILL — Galcanezumab-gnlm Subcutaneous Soln Auto-Injector 120 MG/ML: SUBCUTANEOUS | 30 days supply | Qty: 1 | Fill #5 | Status: AC

## 2023-10-16 NOTE — Progress Notes (Signed)
 Specialty Pharmacy Refill Note    Ernest Weick is a 33 y.o. female, who is being followed by [facility] Specialty Pharmacy team for management of: RxSp Migraine (Enrolled) for the following services:  Refill Management  Benefits and PA Management    Medications monitored by Specialty Pharmacy:   Galcanezumab -gnlm 120 MG/ML Solution Auto-injector   INJECT 2 .PENS INTO THE SKIN EVERY 30 (THIRTY) DAYS FOR 30 DAYS, THEN 1 .PEN EVERY 30 (THIRTY) DAYS.      Refill Coordination  Medications Linked to Program: Galcanezumab -gnlm  HIPAA verified (patient name & dob or patient name & address) with approved contact: Yes  HIPAA verification: please specify: Patient Via Phone Call  How has _______ (specialty medication) helped you manage your _______ (condition) from very well to very poor: Very Well  Changes to allergies?: No  Changes to medications, herbals or supplements?: No  New conditions (diagnosis) since last pharmacy outreach: No  Since the last fill, has the patient experienced any unplanned office visit, urgent care, emergency room, or hospital admission: No  Since the last fill, any new or worsened side effects?: No  Financial problems or insurance changes : No  Since the last fill, has the patient missed any doses of their specialty medication : No  Doses left of specialty medications: 0  Does the patient have the correct number of remaining doses: Yes  Patient confirmations: received welcome packet, patient bill of rights, & privacy practices: Patient has received all  Patient confirmation: patient previously received the drug monograph(s) for their specialty medication(s): Yes    Delivery Information  Delivery confirmation: signature required for delivery: Yes  Delivery method: Courier  Delivery confirmation: delivery address: Delivery Address Reviewed & Accurate  Enter delivery address: [address] [place] [address] (gate code: 3201)  Delivery phone number: [phone]  Delivery confirmation:  patient informed of and confirms delivery date. Enter Delivery date: : 10/20/23  Preferred time?: Anytime  Number of medications in delivery: 1  Is there any medication that is due not being filled?: No  Supplies needed?: Alcohol swabs; Bandages  Does patient have any concerns about safely storing medications (at the correct temperature, away from children/pets,etc.)?: No  Do any medications need mixed or dated?: No  Financial confirmation: patient informed of financial responsibility and copay amount: Yes  Financial responsibility: copay amount: $0  Copay form of payment: Credit card on file  Questions or concerns for the pharmacist?: No  Are any medications first time fills?: No    Emgality  120mg /mL #1/30    Medicare Part B Fill? No    Lonni PARAS Shiela Bruns

## 2023-10-17 ENCOUNTER — Other Ambulatory Visit: Payer: Self-pay

## 2023-11-08 ENCOUNTER — Other Ambulatory Visit: Payer: Self-pay

## 2023-11-11 ENCOUNTER — Other Ambulatory Visit: Payer: Self-pay

## 2023-11-11 MED FILL — Galcanezumab-gnlm Subcutaneous Soln Auto-Injector 120 MG/ML: SUBCUTANEOUS | 30 days supply | Qty: 1 | Fill #6 | Status: AC

## 2023-11-11 NOTE — Progress Notes (Signed)
 Specialty Pharmacy Refill Note    Elaine Mason is a 33 y.o. female, who is being followed by The [facility] Specialty Pharmacy team for management of: RxSp Migraine (Enrolled) for the following services:  Refill Management  Benefits and PA Management    Medications monitored by Specialty Pharmacy:   Galcanezumab -gnlm 120 MG/ML Solution Auto-injector   INJECT 2 .PENS INTO THE SKIN EVERY 30 (THIRTY) DAYS FOR 30 DAYS, THEN 1 .PEN EVERY 30 (THIRTY) DAYS.      Refill Coordination  Medications Linked to Program: Galcanezumab -gnlm  HIPAA verified (patient name & dob or patient name & address) with approved contact: Yes  HIPAA verification: please specify: Elaine Mason [birth year]  How has _______ (specialty medication) helped you manage your _______ (condition) from very well to very poor: Very Well  Changes to allergies?: No  Changes to medications, herbals or supplements?: No  New conditions (diagnosis) since last pharmacy outreach: No  Since the last fill, has the patient experienced any unplanned office visit, urgent care, emergency room, or hospital admission: No  Since the last fill, any new or worsened side effects?: No  Financial problems or insurance changes : No  Since the last fill, has the patient missed any doses of their specialty medication : No  Doses left of specialty medications: 0  Does the patient have the correct number of remaining doses: Yes  Patient confirmations: received welcome packet, patient bill of rights, & privacy practices: Patient has received all  Patient confirmation: patient previously received the drug monograph(s) for their specialty medication(s): Yes    Delivery Information  Delivery confirmation: signature required for delivery: No  Delivery method: Courier  Delivery confirmation: delivery address: Delivery Address Reviewed & Accurate  Enter delivery address: [address] [place] [address] (gate code: 3201)  Delivery phone number: [phone]  Delivery confirmation:  patient informed of and confirms delivery date. Enter Delivery date: : 11/12/23  Preferred time?: Anytime  Number of medications in delivery: 1  Is there any medication that is due not being filled?: No  Supplies needed?: Alcohol swabs; Bandages  Does patient have any concerns about safely storing medications (at the correct temperature, away from children/pets,etc.)?: No  Do any medications need mixed or dated?: No  Financial confirmation: patient informed of financial responsibility and copay amount: Yes  Financial responsibility: copay amount: $0  Copay form of payment: Credit card on file  Questions or concerns for the pharmacist?: No  Are any medications first time fills?: No  Additional assessment notes : Dispensing:Emgality  1/30  ($0)        Medicare Part B Fill? No    [name]

## 2023-12-02 ENCOUNTER — Other Ambulatory Visit: Payer: Self-pay

## 2023-12-04 ENCOUNTER — Other Ambulatory Visit: Payer: Self-pay | Admitting: No Specialty

## 2023-12-04 ENCOUNTER — Other Ambulatory Visit: Payer: Self-pay

## 2023-12-05 ENCOUNTER — Other Ambulatory Visit: Payer: Self-pay

## 2023-12-05 MED FILL — Galcanezumab-gnlm Subcutaneous Soln Auto-Injector 120 MG/ML: SUBCUTANEOUS | 30 days supply | Qty: 1 | Fill #7 | Status: AC

## 2023-12-05 NOTE — Progress Notes (Signed)
 Specialty Pharmacy Refill Note    Elaine Mason is a 33 y.o. female, who is being followed by The [facility] Specialty Pharmacy team for management of: RxSp Migraine (Enrolled) for the following services:  Refill Management  Benefits and PA Management    Medications monitored by Specialty Pharmacy:   Galcanezumab -gnlm 120 MG/ML Solution Auto-injector   INJECT 2 .PENS INTO THE SKIN EVERY 30 (THIRTY) DAYS FOR 30 DAYS, THEN 1 .PEN EVERY 30 (THIRTY) DAYS.      Refill Coordination  Medications Linked to Program: Galcanezumab -gnlm  HIPAA verified (patient name & dob or patient name & address) with approved contact: Yes  HIPAA verification: please specify: self via phone, DOB verified  How has _______ (specialty medication) helped you manage your _______ (condition) from very well to very poor: Very Well  Changes to allergies?: No  Changes to medications, herbals or supplements?: No  New conditions (diagnosis) since last pharmacy outreach: No  Since the last fill, has the patient experienced any unplanned office visit, urgent care, emergency room, or hospital admission: No  Since the last fill, any new or worsened side effects?: No  Financial problems or insurance changes : No  Since the last fill, has the patient missed any doses of their specialty medication : No  Since your last fill, how many doses of your specialty medications were missed?: 0  Doses left of specialty medications: 0  Does the patient have the correct number of remaining doses: Yes  Patient confirmations: received welcome packet, patient bill of rights, & privacy practices: Patient has received all  Patient confirmation: patient previously received the drug monograph(s) for their specialty medication(s): Yes    Delivery Information  Delivery confirmation: signature required for delivery: No  Delivery method: Courier  Delivery confirmation: delivery address: Delivery Address Reviewed & Accurate  Enter delivery address: [address]  [place]  [address]  Delivery phone number: [phone]  Delivery confirmation: patient informed of and confirms delivery date. Enter Delivery date: : 12/09/23  Preferred time?: Anytime  Number of medications in delivery: 1  Is there any medication that is due not being filled?: No  Supplies needed?: Alcohol swabs; Bandages  Does patient have any concerns about safely storing medications (at the correct temperature, away from children/pets,etc.)?: No  Do any medications need mixed or dated?: No  Financial confirmation: patient informed of financial responsibility and copay amount: Yes  Financial responsibility: copay amount: $0  Copay form of payment: Credit card on file  Questions or concerns for the pharmacist?: No  Are any medications first time fills?: No  Additional assessment notes : 14ml/30 $0        Medicare Part B Fill? No    [name]

## 2023-12-30 ENCOUNTER — Other Ambulatory Visit: Payer: Self-pay

## 2024-01-01 ENCOUNTER — Other Ambulatory Visit: Payer: Self-pay

## 2024-01-01 MED FILL — Galcanezumab-gnlm Subcutaneous Soln Auto-Injector 120 MG/ML: SUBCUTANEOUS | 30 days supply | Qty: 1 | Fill #8 | Status: AC

## 2024-01-01 NOTE — Progress Notes (Signed)
 Specialty Pharmacy Refill Note    Elaine Mason is a 33 y.o. female, who is being followed by The [facility] Specialty Pharmacy team for management of: RxSp Migraine (Enrolled) for the following services:  Refill Management  Benefits and PA Management    Medications monitored by Specialty Pharmacy:   Galcanezumab -gnlm 120 MG/ML Solution Auto-injector   INJECT 2 .PENS INTO THE SKIN EVERY 30 (THIRTY) DAYS FOR 30 DAYS, THEN 1 .PEN EVERY 30 (THIRTY) DAYS.      Refill Coordination  Medications Linked to Program: Galcanezumab -gnlm  HIPAA verified (patient name & dob or patient name & address) with approved contact: Yes  HIPAA verification: please specify: Patient via phone  How has _______ (specialty medication) helped you manage your _______ (condition) from very well to very poor: Very Well  Changes to allergies?: No  Changes to medications, herbals or supplements?: Yes  Medication list reconciliation: updates/changes notes: changing from desvenlafaxine to cymbalta  New conditions (diagnosis) since last pharmacy outreach: No  Since the last fill, has the patient experienced any unplanned office visit, urgent care, emergency room, or hospital admission: No  Since the last fill, any new or worsened side effects?: No  Financial problems or insurance changes : No  Since the last fill, has the patient missed any doses of their specialty medication : No  Doses left of specialty medications: 0  Does the patient have the correct number of remaining doses: Yes  Patient confirmations: received welcome packet, patient bill of rights, & privacy practices: Patient has received all  Patient confirmation: patient previously received the drug monograph(s) for their specialty medication(s): Yes    Delivery Information  Delivery confirmation: signature required for delivery: Yes  Delivery method: Courier  Delivery confirmation: delivery address: Delivery Address Reviewed & Accurate  Enter delivery address: [address]   [place] [address]  Delivery phone number: [phone]  Delivery confirmation: patient informed of and confirms delivery date. Enter Delivery date: : 01/05/24  Preferred time?: Anytime  Number of medications in delivery: 1  Is there any medication that is due not being filled?: No  Supplies needed?: Alcohol swabs  Does patient have any concerns about safely storing medications (at the correct temperature, away from children/pets,etc.)?: No  Do any medications need mixed or dated?: No  Financial confirmation: patient informed of financial responsibility and copay amount: Yes  Financial responsibility: copay amount: $0  Copay form of payment: Credit card on file  Questions or concerns for the pharmacist?: No  Are any medications first time fills?: No  Additional assessment notes : Emgality  (1/30/$0)        Medicare Part B Fill? No    Morna Crumb

## 2024-01-02 ENCOUNTER — Other Ambulatory Visit: Payer: Self-pay

## 2024-01-23 ENCOUNTER — Other Ambulatory Visit: Payer: Self-pay

## 2024-01-25 ENCOUNTER — Other Ambulatory Visit: Payer: Self-pay

## 2024-01-27 ENCOUNTER — Other Ambulatory Visit: Payer: Self-pay

## 2024-01-27 MED FILL — Galcanezumab-gnlm Subcutaneous Soln Auto-Injector 120 MG/ML: SUBCUTANEOUS | 30 days supply | Qty: 1 | Fill #9 | Status: AC

## 2024-01-27 NOTE — Progress Notes (Signed)
 Specialty Pharmacy Refill Note    Tristina Emile is a 33 y.o. female, who is being followed by The [facility] Specialty Pharmacy team for management of: RxSp Migraine (Enrolled) for the following services:  Refill Management  Benefits and PA Management    Medications monitored by Specialty Pharmacy:   Galcanezumab -gnlm 120 MG/ML Solution Auto-injector   INJECT 2 .PENS INTO THE SKIN EVERY 30 (THIRTY) DAYS FOR 30 DAYS, THEN 1 .PEN EVERY 30 (THIRTY) DAYS.      Refill Coordination  Medications Linked to Program: Galcanezumab -gnlm  HIPAA verified (patient name & dob or patient name & address) with approved contact: Yes  HIPAA verification: please specify: Self Sep 14, 1990  How has _______ (specialty medication) helped you manage your _______ (condition) from very well to very poor: Very Well  Changes to allergies?: No  Changes to medications, herbals or supplements?: No  New conditions (diagnosis) since last pharmacy outreach: No  Since the last fill, has the patient experienced any unplanned office visit, urgent care, emergency room, or hospital admission: No  Since the last fill, any new or worsened side effects?: No  Financial problems or insurance changes : No  Since the last fill, has the patient missed any doses of their specialty medication : No  Since your last fill, how many doses of your specialty medications were missed?: 0  Why were doses missed?: N/A  Doses left of specialty medications: 0  Does the patient have the correct number of remaining doses: Yes  Patient confirmations: received welcome packet, patient bill of rights, & privacy practices: Patient has received all  Patient confirmation: patient previously received the drug monograph(s) for their specialty medication(s): Yes    Delivery Information  Delivery confirmation: signature required for delivery: No  Delivery method: Courier  Delivery confirmation: delivery address: Delivery Address Reviewed & Accurate  Enter delivery address: [address]  [place] [address]  Delivery phone number: [phone]  Delivery confirmation: patient informed of and confirms delivery date. Enter Delivery date: : 01/30/24  Preferred time?: Anytime  Number of medications in delivery: 1  Is there any medication that is due not being filled?: No  Supplies needed?: Alcohol swabs; Bandages  Does patient have any concerns about safely storing medications (at the correct temperature, away from children/pets,etc.)?: No  Do any medications need mixed or dated?: No  Financial confirmation: patient informed of financial responsibility and copay amount: Yes  Financial responsibility: copay amount: 0  Copay form of payment: Credit card on file  Questions or concerns for the pharmacist?: No  Are any medications first time fills?: No        Medicare Part B Fill? No    Norleen Halls

## 2024-01-29 ENCOUNTER — Other Ambulatory Visit: Payer: Self-pay

## 2024-02-23 ENCOUNTER — Other Ambulatory Visit: Payer: Self-pay

## 2024-02-23 MED FILL — Galcanezumab-gnlm Subcutaneous Soln Auto-Injector 120 MG/ML: SUBCUTANEOUS | 30 days supply | Qty: 1 | Fill #10 | Status: AC

## 2024-02-23 NOTE — Progress Notes (Signed)
 Specialty Pharmacy Refill Note    Elaine Mason is a 33 y.o. female, who is being followed by The [facility] Specialty Pharmacy team for management of: RxSp Migraine (Enrolled) for the following services:  Refill Management  Benefits and PA Management    Medications monitored by Specialty Pharmacy:   Galcanezumab -gnlm 120 MG/ML Solution Auto-injector   INJECT 2 .PENS INTO THE SKIN EVERY 30 (THIRTY) DAYS FOR 30 DAYS, THEN 1 .PEN EVERY 30 (THIRTY) DAYS.      Refill Coordination  Medications Linked to Program: Galcanezumab -gnlm  HIPAA verified (patient name & dob or patient name & address) with approved contact: Yes  HIPAA verification: please specify: Patient via phone  How has _______ (specialty medication) helped you manage your _______ (condition) from very well to very poor: Well  Changes to allergies?: No  Changes to medications, herbals or supplements?: No  New conditions (diagnosis) since last pharmacy outreach: No  Since the last fill, has the patient experienced any unplanned office visit, urgent care, emergency room, or hospital admission: No  Since the last fill, any new or worsened side effects?: No  Financial problems or insurance changes : No  Since the last fill, has the patient missed any doses of their specialty medication : No  Doses left of specialty medications: 0  Does the patient have the correct number of remaining doses: Yes  Patient confirmations: received welcome packet, patient bill of rights, & privacy practices: Patient has received all  Patient confirmation: patient previously received the drug monograph(s) for their specialty medication(s): Yes    Delivery Information  Delivery confirmation: signature required for delivery: No  Delivery method: Courier  Delivery confirmation: delivery address: Delivery Address Reviewed & Accurate  Enter delivery address: [address]  [place] [address]  Delivery phone number: [phone]  Delivery confirmation: patient informed of and  confirms delivery date. Enter Delivery date: : 02/25/24  Preferred time?: Anytime  Number of medications in delivery: 1  Is there any medication that is due not being filled?: No  Supplies needed?: No supplies needed  Does patient have any concerns about safely storing medications (at the correct temperature, away from children/pets,etc.)?: No  Do any medications need mixed or dated?: No  Financial confirmation: patient informed of financial responsibility and copay amount: Yes  Financial responsibility: copay amount: $0  Copay form of payment: Credit card on file  Questions or concerns for the pharmacist?: No  Are any medications first time fills?: No  Additional assessment notes : Emgality  (1/30/$0)        Medicare Part B Fill? No    Elaine Mason

## 2024-02-24 ENCOUNTER — Other Ambulatory Visit: Payer: Self-pay

## 2024-03-17 ENCOUNTER — Other Ambulatory Visit: Payer: Self-pay

## 2024-03-19 ENCOUNTER — Other Ambulatory Visit: Payer: Self-pay

## 2024-03-19 MED FILL — Galcanezumab-gnlm Subcutaneous Soln Auto-Injector 120 MG/ML: SUBCUTANEOUS | 30 days supply | Qty: 1 | Fill #11 | Status: AC

## 2024-03-19 NOTE — Progress Notes (Signed)
 Specialty Pharmacy Refill Note    Avis Lotz is a 33 y.o. female, who is being followed by The [facility] Specialty Pharmacy team for management of: RxSp Migraine (Enrolled) for the following services:  Refill Management  Benefits and PA Management    Medications monitored by Specialty Pharmacy:   Galcanezumab -gnlm 120 MG/ML Solution Auto-injector   INJECT 2 .PENS INTO THE SKIN EVERY 30 (THIRTY) DAYS FOR 30 DAYS, THEN 1 .PEN EVERY 30 (THIRTY) DAYS.      Refill Coordination  Medications Linked to Program: Galcanezumab -gnlm  HIPAA verified (patient name & dob or patient name & address) with approved contact: Yes  HIPAA verification: please specify: via phone call  How has _______ (specialty medication) helped you manage your _______ (condition) from very well to very poor: Well  Changes to allergies?: No  Changes to medications, herbals or supplements?: No  New conditions (diagnosis) since last pharmacy outreach: No  Since the last fill, has the patient experienced any unplanned office visit, urgent care, emergency room, or hospital admission: No  Since the last fill, any new or worsened side effects?: No  Financial problems or insurance changes : No  Since the last fill, has the patient missed any doses of their specialty medication : No  Since your last fill, how many doses of your specialty medications were missed?: 0  Why were doses missed?: N/A  Doses left of specialty medications: 0  Does the patient have the correct number of remaining doses: Yes  Patient confirmations: received welcome packet, patient bill of rights, & privacy practices: Patient has received all  Patient confirmation: patient previously received the drug monograph(s) for their specialty medication(s): Yes    Delivery Information  Delivery confirmation: signature required for delivery: No  Delivery method: Courier  Delivery confirmation: delivery address: Delivery Address Reviewed & Accurate  Enter delivery address: [address]   [place] [address]  Delivery phone number: [phone]  Delivery confirmation: patient informed of and confirms delivery date. Enter Delivery date: : 03/23/24  Preferred time?: Anytime  Along with the specialty medication listed in this request, are there any additional medication(s) you need refilled now?: No  Number of medications in delivery: 1  Supplies needed?: No supplies needed  Does patient have any concerns about safely storing medications (at the correct temperature, away from children/pets,etc.)?: No  Do any medications need mixed or dated?: No  Financial confirmation: patient informed of financial responsibility and copay amount: Yes  Questions or concerns for the pharmacist?: No  Are any medications first time fills?: No  Additional assessment notes : 1/30 $0        Medicare Part B Fill? No    Earnie Bless

## 2024-03-22 ENCOUNTER — Ambulatory Visit (INDEPENDENT_AMBULATORY_CARE_PROVIDER_SITE_OTHER): Admitting: Physician Assistant

## 2024-03-22 ENCOUNTER — Encounter (INDEPENDENT_AMBULATORY_CARE_PROVIDER_SITE_OTHER): Payer: Self-pay

## 2024-03-22 ENCOUNTER — Other Ambulatory Visit: Payer: Self-pay

## 2024-03-22 VITALS — BP 105/70 | HR 91 | Temp 97.5°F | Resp 14 | Ht 64.0 in | Wt 120.0 lb

## 2024-03-22 DIAGNOSIS — J069 Acute upper respiratory infection, unspecified: Secondary | ICD-10-CM

## 2024-03-22 DIAGNOSIS — R051 Acute cough: Secondary | ICD-10-CM

## 2024-03-22 LAB — POCT INFLUENZA A/B
POCT Rapid Influenza A AG: NEGATIVE
POCT Rapid Influenza B AG: NEGATIVE

## 2024-03-22 LAB — POC COVID QUICKVUE ANTIGEN: QuickVue SARS COV2 Antigen POCT: NEGATIVE

## 2024-03-22 MED ORDER — METHYLPREDNISOLONE 4 MG PO TBPK: ORAL_TABLET | ORAL | 0 refills | Status: AC

## 2024-03-22 MED ORDER — BENZONATATE 100 MG PO CAPS: 100.0000 mg | ORAL_CAPSULE | Freq: Three times a day (TID) | ORAL | 0 refills | Status: AC | PRN

## 2024-03-22 NOTE — Patient Instructions (Signed)
 You are seen today for cough with congestion.Your flu and COVID test are negative.  Please continue use of your inhaler as discussed.  Please take Tessalon  3 times a day for cough.  Please follow-up with your family physician as discussed

## 2024-03-22 NOTE — Progress Notes (Unsigned)
 [facility] Mason [facility]  OFFICE NOTE         Subjective   Historian: Patient  {The patient needed interpreter services to provide history (Optional):29381}    Chief Complaint   Patient presents with    Illness     Coughing, tightness in chest, and dryness in nose and throat for about a week.        Illness      Elaine Mason is a 33 y.o. female who presents for about 7 days of cough with chest tightness, congestion.  Denies any fevers or chills.  Patient with history of chronic balance issues/POTS.  Endorses stable oral intake with no vomiting or diarrhea.  Has been using inhaler as needed no neb treatments.  No recent sick contacts or pulmonary irritants.  No recent changes in medications.      History:  Medications and Allergies reviewed.   Pertinent Past Medical, Surgical, Family and Social History were reviewed.  {  Disappearing Text  Click a link below to be taken to that activity or part of the chart   Chart Review  Order Review  Review Flowsheets  Labs  Health Maintenance  Immunizations  Allergies  Medications  Problem List  History  Synopsis   :55325}        Objective     Vitals:    03/22/24 1530   BP: 105/70   BP Site: Right arm   Patient Position: Sitting   Cuff Size: Medium   Pulse: 91   Resp: 14   Temp: 97.5 F (36.4 C)   TempSrc: Tympanic   SpO2: 99%   Weight: 54.4 kg (120 lb)   Height: 1.626 m (5' 4)      Body mass index is 20.6 kg/m.          Physical Exam  Constitutional:       General: She is not in acute distress.     Appearance: She is well-developed. She is not ill-appearing, toxic-appearing or diaphoretic.      Comments: Pleasant, conversant   HENT:      Head: Normocephalic and atraumatic.      Right Ear: Tympanic membrane and external ear normal.      Left Ear: Tympanic membrane and external ear normal.      Nose: Nose normal.      Mouth/Throat:      Pharynx: Uvula midline. No oropharyngeal exudate.     Eyes: Lids are normal. Pupils are equal, round, and reactive to light.  Right eye exhibits no discharge. Left eye exhibits no discharge. Neck:      Vascular: No JVD.      Trachea: No tracheal deviation.   Cardiovascular:      Rate and Rhythm: Normal rate and regular rhythm.      Heart sounds: Normal heart sounds, S1 normal and S2 normal. No murmur heard.  Pulmonary:      Effort: Pulmonary effort is normal. No respiratory distress.      Breath sounds: Normal breath sounds. No stridor. No decreased breath sounds, wheezing, rhonchi or rales.   Abdominal:      General: Bowel sounds are normal.      Palpations: Abdomen is soft.      Tenderness: There is no abdominal tenderness.      Comments: Benign exam   Musculoskeletal:         General: Normal range of motion.      Cervical back: Full passive range of motion without pain and  normal range of motion.   Neurological:      Mental Status: She is alert and oriented to person, place, and time.      GCS: GCS eye subscore is 4. GCS verbal subscore is 5. GCS motor subscore is 6.   Skin:     General: Skin is warm and dry.      Findings: No rash.   Psychiatric:         Speech: Speech normal.         Behavior: Behavior normal.   Vitals and nursing note reviewed.     Urgent Care Course   There were no labs reviewed with this patient during the visit.    There were no x-rays reviewed with this patient during the visit.  {Preliminary Read Documentation (Optional):71470}    Procedures   Procedures     Assessment / Plan     Differential Diagnoses including but not limited to:     Viral upper respiratory infection, acute COVID, acute influenza, asthma exacerbation without wheezing        [name] was seen today for illness.    Diagnoses and all orders for this visit:    Acute cough  -     QuickVue SARS-COV-2 Antigen POCT; Future  -     POCT Influenza A/B; Future  -     QuickVue SARS-COV-2 Antigen POCT  -     POCT Influenza A/B         The indications for early follow-up with PCP and return to UC were discussed. Patient/family received education on the  working diagnosis, diagnostic uncertainties, and proposed treatment plan. Indications for emergency evaluation in the ED were reviewed. Written and verbal discharge instructions were provided and discussed and all questions from the patient/family were addressed, with no apparent barriers.

## 2024-04-14 ENCOUNTER — Other Ambulatory Visit: Payer: Self-pay

## 2024-04-16 ENCOUNTER — Other Ambulatory Visit: Payer: Self-pay

## 2024-04-16 MED FILL — Galcanezumab-gnlm Subcutaneous Soln Auto-Injector 120 MG/ML: SUBCUTANEOUS | 30 days supply | Qty: 1 | Fill #12 | Status: AC

## 2024-04-16 NOTE — Progress Notes (Signed)
 Specialty Pharmacy Refill Note    Elaine Mason is a 34 y.o. female, who is being followed by [facility] team for management of: RxSp [facility]raine (Enrolled) for the following services:  Refill Management  Benefits and PA Management    Medications monitored by Specialty Pharmacy:   Galcanezumab -gnlm 120 MG/ML Solution Auto-injector   INJECT 2 .PENS INTO THE SKIN EVERY 30 (THIRTY) DAYS FOR 30 DAYS, THEN 1 .PEN EVERY 30 (THIRTY) DAYS.      Refill Coordination  Medications Linked to Program: Galcanezumab -gnlm  HIPAA verified (patient name & dob or patient name & address) with approved contact: Yes  HIPAA verification: please specify: spoke to  pt on the phone, DOB verified  How has _______ (specialty medication) helped you manage your _______ (condition) from very well to very poor: Well  Changes to allergies?: No  Changes to medications, herbals or supplements?: No  New conditions (diagnosis) since last pharmacy outreach: No  Since the last fill, has the patient experienced any unplanned office visit, urgent care, emergency room, or hospital admission: No  Since the last fill, any new or worsened side effects?: No  Financial problems or insurance changes : No  Since the last fill, has the patient missed any doses of their specialty medication : No  Since your last fill, how many doses of your specialty medications were missed?: 0  Why were doses missed?: N/A  Doses left of specialty medications: 0  Does the patient have the correct number of remaining doses: Yes  Patient confirmations: received welcome packet, patient bill of rights, & privacy practices: Patient has received all  Patient confirmation: patient previously received the drug monograph(s) for their specialty medication(s): Yes    Delivery Information  Delivery confirmation: signature required for delivery: No  Delivery method: Courier  Delivery confirmation: delivery address: Delivery Address Reviewed & Accurate  Enter delivery address: [address]  [place] [address]  Delivery phone number: [phone]  Delivery confirmation: patient informed of and confirms delivery date. Enter Delivery date: : 04/20/24  Preferred time?: Anytime  Along with the specialty medication listed in this request, are there any additional medication(s) you need refilled now?: No  Number of medications in delivery: 1  Supplies needed?: No supplies needed  Does patient have any concerns about safely storing medications (at the correct temperature, away from children/pets,etc.)?: No  Do any medications need mixed or dated?: No  Financial confirmation: patient informed of financial responsibility and copay amount: Yes  Financial responsibility: copay amount: $0  Copay form of payment: Credit card on file  Questions or concerns for the pharmacist?: No  Are any medications first time fills?: No  Additional assessment notes : 30/30 $0        Medicare Part B Fill? No    Elaine Mason

## 2024-04-19 ENCOUNTER — Other Ambulatory Visit: Payer: Self-pay

## 2024-05-12 ENCOUNTER — Other Ambulatory Visit: Payer: Self-pay
